# Patient Record
Sex: Female | Born: 1970 | Race: White | Hispanic: No | Marital: Married | Smoking: Former smoker
Health system: Southern US, Community
[De-identification: ages and names within clinical notes are randomized; demographics above are authoritative.]

## PROBLEM LIST (undated history)

## (undated) DIAGNOSIS — I10 Essential (primary) hypertension: Secondary | ICD-10-CM

## (undated) DIAGNOSIS — G43909 Migraine, unspecified, not intractable, without status migrainosus: Secondary | ICD-10-CM

## (undated) DIAGNOSIS — J302 Other seasonal allergic rhinitis: Secondary | ICD-10-CM

## (undated) DIAGNOSIS — J45909 Unspecified asthma, uncomplicated: Secondary | ICD-10-CM

## (undated) DIAGNOSIS — K589 Irritable bowel syndrome without diarrhea: Secondary | ICD-10-CM

## (undated) DIAGNOSIS — M1990 Unspecified osteoarthritis, unspecified site: Secondary | ICD-10-CM

## (undated) DIAGNOSIS — R112 Nausea with vomiting, unspecified: Secondary | ICD-10-CM

## (undated) DIAGNOSIS — R51 Headache: Secondary | ICD-10-CM

## (undated) DIAGNOSIS — F329 Major depressive disorder, single episode, unspecified: Secondary | ICD-10-CM

## (undated) DIAGNOSIS — E039 Hypothyroidism, unspecified: Secondary | ICD-10-CM

## (undated) DIAGNOSIS — L708 Other acne: Secondary | ICD-10-CM

## (undated) DIAGNOSIS — K1370 Unspecified lesions of oral mucosa: Secondary | ICD-10-CM

## (undated) DIAGNOSIS — K219 Gastro-esophageal reflux disease without esophagitis: Secondary | ICD-10-CM

## (undated) DIAGNOSIS — F419 Anxiety disorder, unspecified: Secondary | ICD-10-CM

## (undated) DIAGNOSIS — R768 Other specified abnormal immunological findings in serum: Secondary | ICD-10-CM

## (undated) DIAGNOSIS — F99 Mental disorder, not otherwise specified: Secondary | ICD-10-CM

## (undated) DIAGNOSIS — F319 Bipolar disorder, unspecified: Secondary | ICD-10-CM

## (undated) DIAGNOSIS — Z9889 Other specified postprocedural states: Secondary | ICD-10-CM

## (undated) DIAGNOSIS — R030 Elevated blood-pressure reading, without diagnosis of hypertension: Secondary | ICD-10-CM

## (undated) DIAGNOSIS — T7840XA Allergy, unspecified, initial encounter: Secondary | ICD-10-CM

## (undated) HISTORY — DX: Unspecified osteoarthritis, unspecified site: M19.90

## (undated) HISTORY — DX: Allergy, unspecified, initial encounter: T78.40XA

## (undated) HISTORY — DX: Unspecified asthma, uncomplicated: J45.909

## (undated) HISTORY — PX: WISDOM TOOTH EXTRACTION: SHX21

## (undated) HISTORY — PX: OTHER SURGICAL HISTORY: SHX169

## (undated) HISTORY — DX: Essential (primary) hypertension: I10

## (undated) HISTORY — PX: POLYPECTOMY: SHX149

## (undated) HISTORY — PX: COLONOSCOPY: SHX174

## (undated) HISTORY — PX: COLONOSCOPY W/ POLYPECTOMY: SHX1380

## (undated) HISTORY — PX: EYE SURGERY: SHX253

## (undated) HISTORY — DX: Migraine, unspecified, not intractable, without status migrainosus: G43.909

---
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
## 1994-04-25 HISTORY — PX: OTHER SURGICAL HISTORY: SHX169

## 2003-11-21 ENCOUNTER — Other Ambulatory Visit: Admission: RE | Admit: 2003-11-21 | Discharge: 2003-11-21 | Payer: Self-pay | Admitting: Obstetrics and Gynecology

## 2004-10-04 ENCOUNTER — Other Ambulatory Visit: Admission: RE | Admit: 2004-10-04 | Discharge: 2004-10-04 | Payer: Self-pay | Admitting: Obstetrics and Gynecology

## 2004-12-01 ENCOUNTER — Encounter: Admission: RE | Admit: 2004-12-01 | Discharge: 2004-12-01 | Payer: Self-pay | Admitting: Obstetrics and Gynecology

## 2005-01-31 ENCOUNTER — Ambulatory Visit: Payer: Self-pay | Admitting: Family Medicine

## 2005-09-27 ENCOUNTER — Ambulatory Visit: Payer: Self-pay | Admitting: Family Medicine

## 2005-10-06 ENCOUNTER — Ambulatory Visit: Payer: Self-pay | Admitting: Sports Medicine

## 2006-08-24 ENCOUNTER — Ambulatory Visit: Payer: Self-pay | Admitting: Internal Medicine

## 2006-09-08 DIAGNOSIS — F419 Anxiety disorder, unspecified: Secondary | ICD-10-CM | POA: Insufficient documentation

## 2006-09-08 DIAGNOSIS — L708 Other acne: Secondary | ICD-10-CM

## 2006-09-08 DIAGNOSIS — J45990 Exercise induced bronchospasm: Secondary | ICD-10-CM | POA: Insufficient documentation

## 2006-09-08 DIAGNOSIS — F329 Major depressive disorder, single episode, unspecified: Secondary | ICD-10-CM

## 2006-09-08 DIAGNOSIS — J302 Other seasonal allergic rhinitis: Secondary | ICD-10-CM | POA: Insufficient documentation

## 2006-09-08 DIAGNOSIS — F32A Depression, unspecified: Secondary | ICD-10-CM

## 2006-09-08 HISTORY — DX: Depression, unspecified: F32.A

## 2006-09-08 HISTORY — DX: Anxiety disorder, unspecified: F41.9

## 2006-09-08 HISTORY — DX: Other acne: L70.8

## 2006-09-13 ENCOUNTER — Encounter: Payer: Self-pay | Admitting: Internal Medicine

## 2006-09-13 ENCOUNTER — Ambulatory Visit: Payer: Self-pay | Admitting: Internal Medicine

## 2006-09-13 DIAGNOSIS — R030 Elevated blood-pressure reading, without diagnosis of hypertension: Secondary | ICD-10-CM

## 2006-09-13 HISTORY — DX: Elevated blood-pressure reading, without diagnosis of hypertension: R03.0

## 2006-09-13 LAB — CONVERTED CEMR LAB
ALT: 16 units/L (ref 0–40)
Alkaline Phosphatase: 46 units/L (ref 39–117)
Basophils Absolute: 0 10*3/uL (ref 0.0–0.1)
CO2: 29 meq/L (ref 19–32)
Calcium: 9.5 mg/dL (ref 8.4–10.5)
Chloride: 107 meq/L (ref 96–112)
GFR calc Af Amer: 122 mL/min
GFR calc non Af Amer: 101 mL/min
HCT: 37.1 % (ref 36.0–46.0)
HDL: 79.6 mg/dL (ref >39.0)
LDL Cholesterol: 108 mg/dL — ABNORMAL HIGH (ref 0–99)
MCHC: 34.5 g/dL (ref 30.0–36.0)
MCV: 89.7 fL (ref 78.0–100.0)
Neutro Abs: 3.6 10*3/uL (ref 1.4–7.7)
Sodium: 140 meq/L (ref 135–145)
Total Bilirubin: 1.1 mg/dL (ref 0.3–1.2)
Total Protein: 7.1 g/dL (ref 6.0–8.3)
Triglycerides: 62 mg/dL (ref 0–149)
VLDL: 12 mg/dL (ref 0–40)
WBC: 5.6 10*3/uL (ref 4.5–10.5)

## 2007-11-07 ENCOUNTER — Encounter (INDEPENDENT_AMBULATORY_CARE_PROVIDER_SITE_OTHER): Payer: Self-pay | Admitting: *Deleted

## 2007-11-07 ENCOUNTER — Ambulatory Visit: Payer: Self-pay | Admitting: Sports Medicine

## 2007-11-09 ENCOUNTER — Encounter (INDEPENDENT_AMBULATORY_CARE_PROVIDER_SITE_OTHER): Payer: Self-pay | Admitting: *Deleted

## 2009-01-13 ENCOUNTER — Encounter: Admission: RE | Admit: 2009-01-13 | Discharge: 2009-01-13 | Payer: Self-pay | Admitting: Obstetrics and Gynecology

## 2009-03-24 ENCOUNTER — Ambulatory Visit: Payer: Self-pay | Admitting: Internal Medicine

## 2009-03-24 DIAGNOSIS — K1370 Unspecified lesions of oral mucosa: Secondary | ICD-10-CM

## 2009-03-24 HISTORY — DX: Unspecified lesions of oral mucosa: K13.70

## 2009-06-24 ENCOUNTER — Encounter (INDEPENDENT_AMBULATORY_CARE_PROVIDER_SITE_OTHER): Payer: Self-pay | Admitting: *Deleted

## 2009-06-24 ENCOUNTER — Ambulatory Visit: Payer: Self-pay | Admitting: Sports Medicine

## 2010-04-25 HISTORY — PX: OTHER SURGICAL HISTORY: SHX169

## 2010-05-26 NOTE — Miscellaneous (Signed)
Summary: TDAP  Clinical Lists Changes  Orders: Added new Service order of Tdap => 72yrs IM (16109) - Signed Added new Service order of Admin 1st Vaccine (60454) - Signed Added new Service order of Admin 1st Vaccine [facility]) [phone]) - Signed Observations: Added new observation of TD BOOST VIS: 03/13/07 version given June 24, 2009. (06/24/2009 14:34) Added new observation of TD BOOSTERLO: [id number] (06/24/2009 14:34) Added new observation of TD BOOST EXP: 07/18/2011 (06/24/2009 14:34) Added new observation of TD BOOSTERBY: Glendal Cassaday CMA (06/24/2009 14:34) Added new observation of TD BOOSTERRT: IM (06/24/2009 14:34) Added new observation of TDBOOSTERDSE: 0.5 ml (06/24/2009 14:34) Added new observation of TD BOOSTERMF: Gl[employer] (06/24/2009 14:34) Added new observation of TD BOOST SIT: right deltoid (06/24/2009 14:34) Added new observation of TD BOOSTER: Tdap (06/24/2009 14:34)      Tetanus/Td Vaccine    Vaccine Type: Tdap    Site: right deltoid    Mfr: GlaxoSmithKline    Dose: 0.5 ml    Route: IM    Given by: Lillia Pauls CMA    Exp. Date: 07/18/2011    Lot #: [id number]    VIS given: 03/13/07 version given June 24, 2009.  Appended Document: TDAP     Allergies: No Known Drug Allergies   Complete Medication List: 1)  Doxycycline Hyclate 20 Mg Tabs (Doxycycline hyclate) .Marland Kitchen.. 1 tab by mouth daily 2)  Celexa 20 Mg Tabs (Citalopram hydrobromide) .Marland Kitchen.. 1 tab by mouth daily

## 2012-10-10 ENCOUNTER — Encounter (HOSPITAL_COMMUNITY): Payer: Self-pay | Admitting: Pharmacist

## 2012-10-16 ENCOUNTER — Other Ambulatory Visit: Payer: Self-pay | Admitting: Obstetrics and Gynecology

## 2012-10-16 NOTE — H&P (Signed)
42 y.o. yo complains of rectocele with splinting and close to introitus- had to push. Also menorrhagia- No IM. Using a lot pads. Lasting 10 days.Much longer periods and much heavier. Bleeding through tampons and pads. No IM. now bothering for one week. No issues with bladder. No leaking with cough or sneeze. No prolapse of ?cystocele. Felt like tampon. Pt's husband had vasectomy.  PMH: none. PSH: wrist, ankle, vein. Pgyn: no abnl paps.   PSH: sometime smoker  Meds: Effexor. clonazepam  No Known Allergies  @VITALS2 @  Lungs: clear to ascultation Cor:  RRR Abdomen:  soft, nontender, nondistended. Ex:  no cords, erythema Pelvic:  Female Genitalia: Vulva: no masses, atrophy, or lesions. Vagina: no tenderness, erythema, cystocele, abnormal vaginal discharge, or vesicle(s) or ulcers and rectocele; to 0; cystocele -2; no real movement of urethra; some decensus but not a lot.. Cervix: no discharge or cervical motion tenderness and grossly normal. Uterus: midline, mobile, non-tender, normal shape, no uterine prolapse, and enlarged; 9-10 weeks size. Bladder/Urethra: no urethral discharge or mass and normal meatus, bladder non distended, and Urethra well supported. Adnexa/Parametria: no parametrial tenderness or mass and no adnexal tenderness or ovarian mass.\Rectal Exam: Rectum: normal perianal skin and sphincter tone and no hemorrhoids or masses.  [place] 8x4.5x4, em 9 mm; LO 3 Lcyst 2.2; RO 2.1. Simple cyst, no increase in BF. No ff.   A:  Menorrhagia and symptomatic rectocele.  No SUI and no significant cystocele.  Husband had vasectomy.   P:  For d&c, hysterscopy, Novasure, posterior repair.   All risks, benefits and alternatives d/w patient and she desires to proceed.  Patient will receive preop antibiotics and SCDs during the operation.     Maybelle Depaoli A

## 2012-10-17 ENCOUNTER — Encounter (HOSPITAL_COMMUNITY): Payer: Self-pay | Admitting: Anesthesiology

## 2012-10-17 ENCOUNTER — Ambulatory Visit (HOSPITAL_COMMUNITY)
Admission: RE | Admit: 2012-10-17 | Discharge: 2012-10-17 | Disposition: A | Discharge status: Home / Self Care | Payer: BC Managed Care – PPO | Source: Ambulatory Visit | Attending: Obstetrics and Gynecology | Admitting: Obstetrics and Gynecology

## 2012-10-17 ENCOUNTER — Encounter (HOSPITAL_COMMUNITY): Payer: Self-pay | Admitting: *Deleted

## 2012-10-17 ENCOUNTER — Ambulatory Visit (HOSPITAL_COMMUNITY): Payer: BC Managed Care – PPO | Admitting: Anesthesiology

## 2012-10-17 ENCOUNTER — Encounter (HOSPITAL_COMMUNITY)
Admission: RE | Disposition: A | Discharge status: Home / Self Care | Payer: Self-pay | Source: Ambulatory Visit | Attending: Obstetrics and Gynecology

## 2012-10-17 DIAGNOSIS — N920 Excessive and frequent menstruation with regular cycle: Secondary | ICD-10-CM | POA: Insufficient documentation

## 2012-10-17 DIAGNOSIS — N816 Rectocele: Secondary | ICD-10-CM | POA: Insufficient documentation

## 2012-10-17 HISTORY — PX: RECTOCELE REPAIR: SHX761

## 2012-10-17 HISTORY — DX: Headache: R51

## 2012-10-17 HISTORY — DX: Nausea with vomiting, unspecified: R11.2

## 2012-10-17 HISTORY — DX: Mental disorder, not otherwise specified: F99

## 2012-10-17 HISTORY — DX: Gastro-esophageal reflux disease without esophagitis: K21.9

## 2012-10-17 HISTORY — DX: Other specified postprocedural states: Z98.890

## 2012-10-17 HISTORY — PX: DILITATION & CURRETTAGE/HYSTROSCOPY WITH NOVASURE ABLATION: SHX5568

## 2012-10-17 HISTORY — DX: Other seasonal allergic rhinitis: J30.2

## 2012-10-17 HISTORY — DX: Anxiety disorder, unspecified: F41.9

## 2012-10-17 LAB — CBC
HCT: 38.4 % (ref 36.0–46.0)
MCHC: 34.6 g/dL (ref 30.0–36.0)
MCV: 89.5 fL (ref 78.0–100.0)
RBC: 4.29 MIL/uL (ref 3.87–5.11)
RDW: 13.6 % (ref 11.5–15.5)

## 2012-10-17 SURGERY — DILATATION & CURETTAGE/HYSTEROSCOPY WITH NOVASURE ABLATION
Anesthesia: General | Site: Vagina | Wound class: Clean Contaminated

## 2012-10-17 MED ORDER — ESTRADIOL 0.1 MG/GM VA CREA
TOPICAL_CREAM | VAGINAL | Status: AC
  Filled 2012-10-17: qty 42.5

## 2012-10-17 MED ORDER — LIDOCAINE-EPINEPHRINE 0.5 %-1:200000 IJ SOLN
INTRAMUSCULAR | Status: DC | PRN
  Administered 2012-10-17: 9 mL

## 2012-10-17 MED ORDER — PROMETHAZINE HCL 12.5 MG PO TABS: 12.5000 mg | ORAL_TABLET | Freq: Four times a day (QID) | ORAL | Status: DC | PRN

## 2012-10-17 MED ORDER — PROMETHAZINE HCL 25 MG/ML IJ SOLN: 6.2500 mg | INTRAMUSCULAR | Status: DC | PRN

## 2012-10-17 MED ORDER — MIDAZOLAM HCL 2 MG/2ML IJ SOLN
INTRAMUSCULAR | Status: AC
  Filled 2012-10-17: qty 2

## 2012-10-17 MED ORDER — CEFAZOLIN SODIUM-DEXTROSE 2-3 GM-% IV SOLR
INTRAVENOUS | Status: AC
  Filled 2012-10-17: qty 50

## 2012-10-17 MED ORDER — OXYCODONE-ACETAMINOPHEN 5-325 MG PO TABS: 1.0000 | ORAL_TABLET | ORAL | Status: DC | PRN

## 2012-10-17 MED ORDER — KETOROLAC TROMETHAMINE 30 MG/ML IJ SOLN: 15.0000 mg | Freq: Once | INTRAMUSCULAR | Status: DC | PRN

## 2012-10-17 MED ORDER — DEXAMETHASONE SODIUM PHOSPHATE 10 MG/ML IJ SOLN
INTRAMUSCULAR | Status: DC | PRN
  Administered 2012-10-17: 10 mg via INTRAVENOUS

## 2012-10-17 MED ORDER — MEPERIDINE HCL 25 MG/ML IJ SOLN
INTRAMUSCULAR | Status: AC
  Administered 2012-10-17: 12.5 mg via INTRAVENOUS
  Filled 2012-10-17: qty 1

## 2012-10-17 MED ORDER — LACTATED RINGERS IV SOLN
INTRAVENOUS | Status: DC
  Administered 2012-10-17 (×3): via INTRAVENOUS

## 2012-10-17 MED ORDER — KETOROLAC TROMETHAMINE 30 MG/ML IJ SOLN
INTRAMUSCULAR | Status: AC
  Filled 2012-10-17: qty 1

## 2012-10-17 MED ORDER — MIDAZOLAM HCL 2 MG/2ML IJ SOLN: 0.5000 mg | Freq: Once | INTRAMUSCULAR | Status: DC | PRN

## 2012-10-17 MED ORDER — FENTANYL CITRATE 0.05 MG/ML IJ SOLN: 25.0000 ug | INTRAMUSCULAR | Status: DC | PRN

## 2012-10-17 MED ORDER — OXYCODONE-ACETAMINOPHEN 5-325 MG PO TABS
ORAL_TABLET | ORAL | Status: AC
  Administered 2012-10-17: 1 via ORAL
  Filled 2012-10-17: qty 1

## 2012-10-17 MED ORDER — LIDOCAINE-EPINEPHRINE 0.5 %-1:200000 IJ SOLN
INTRAMUSCULAR | Status: AC
  Filled 2012-10-17: qty 1

## 2012-10-17 MED ORDER — FENTANYL CITRATE 0.05 MG/ML IJ SOLN
INTRAMUSCULAR | Status: AC
  Filled 2012-10-17: qty 5

## 2012-10-17 MED ORDER — CEFAZOLIN SODIUM-DEXTROSE 2-3 GM-% IV SOLR
2.0000 g | INTRAVENOUS | Status: AC
  Administered 2012-10-17: 2 g via INTRAVENOUS

## 2012-10-17 MED ORDER — ESTRADIOL 0.1 MG/GM VA CREA
TOPICAL_CREAM | VAGINAL | Status: DC | PRN
  Administered 2012-10-17: 1 via VAGINAL

## 2012-10-17 MED ORDER — MEPERIDINE HCL 25 MG/ML IJ SOLN
6.2500 mg | INTRAMUSCULAR | Status: DC | PRN
  Administered 2012-10-17: 12.5 mg via INTRAVENOUS

## 2012-10-17 MED ORDER — SCOPOLAMINE 1 MG/3DAYS TD PT72: 1.0000 | MEDICATED_PATCH | TRANSDERMAL | Status: DC

## 2012-10-17 MED ORDER — SCOPOLAMINE 1 MG/3DAYS TD PT72
MEDICATED_PATCH | TRANSDERMAL | Status: AC
  Administered 2012-10-17: 1.5 mg via TRANSDERMAL
  Filled 2012-10-17: qty 1

## 2012-10-17 MED ORDER — LIDOCAINE HCL (CARDIAC) 20 MG/ML IV SOLN
INTRAVENOUS | Status: AC
  Filled 2012-10-17: qty 5

## 2012-10-17 MED ORDER — KETOROLAC TROMETHAMINE 30 MG/ML IJ SOLN
INTRAMUSCULAR | Status: DC | PRN
  Administered 2012-10-17: 30 mg via INTRAVENOUS

## 2012-10-17 MED ORDER — PROPOFOL 10 MG/ML IV BOLUS
INTRAVENOUS | Status: DC | PRN
  Administered 2012-10-17: 200 mg via INTRAVENOUS

## 2012-10-17 MED ORDER — MIDAZOLAM HCL 5 MG/5ML IJ SOLN
INTRAMUSCULAR | Status: DC | PRN
  Administered 2012-10-17: 2 mg via INTRAVENOUS

## 2012-10-17 MED ORDER — DEXAMETHASONE SODIUM PHOSPHATE 10 MG/ML IJ SOLN
INTRAMUSCULAR | Status: AC
  Filled 2012-10-17: qty 1

## 2012-10-17 MED ORDER — FENTANYL CITRATE 0.05 MG/ML IJ SOLN
INTRAMUSCULAR | Status: DC | PRN
  Administered 2012-10-17 (×4): 50 ug via INTRAVENOUS

## 2012-10-17 MED ORDER — LACTATED RINGERS IR SOLN
Status: DC | PRN
  Administered 2012-10-17: 3000 mL

## 2012-10-17 MED ORDER — LIDOCAINE HCL (CARDIAC) 20 MG/ML IV SOLN
INTRAVENOUS | Status: DC | PRN
  Administered 2012-10-17: 50 mg via INTRAVENOUS

## 2012-10-17 MED ORDER — ONDANSETRON HCL 4 MG/2ML IJ SOLN
INTRAMUSCULAR | Status: AC
  Filled 2012-10-17: qty 2

## 2012-10-17 MED ORDER — PROPOFOL 10 MG/ML IV EMUL
INTRAVENOUS | Status: AC
  Filled 2012-10-17: qty 20

## 2012-10-17 SURGICAL SUPPLY — 28 items
ABLATOR ENDOMETRIAL BIPOLAR (ABLATOR) ×2 IMPLANT
CANISTER SUCTION 2500CC (MISCELLANEOUS) ×2 IMPLANT
CATH ROBINSON RED A/P 16FR (CATHETERS) ×2 IMPLANT
CLOTH BEACON ORANGE TIMEOUT ST (SAFETY) ×2 IMPLANT
CONT PATH 16OZ SNAP LID 3702 (MISCELLANEOUS) IMPLANT
CONTAINER PREFILL 10% NBF 60ML (FORM) ×4 IMPLANT
DECANTER SPIKE VIAL GLASS SM (MISCELLANEOUS) IMPLANT
DRESSING TELFA 8X3 (GAUZE/BANDAGES/DRESSINGS) ×2 IMPLANT
ELECT REM PT RETURN 9FT ADLT (ELECTROSURGICAL)
ELECTRODE REM PT RTRN 9FT ADLT (ELECTROSURGICAL) IMPLANT
GAUZE PACKING 2X5 YD STERILE (GAUZE/BANDAGES/DRESSINGS) ×2 IMPLANT
GLOVE BIO SURGEON STRL SZ7 (GLOVE) ×2 IMPLANT
GOWN STRL REIN XL XLG (GOWN DISPOSABLE) ×8 IMPLANT
LOOP ANGLED CUTTING 22FR (CUTTING LOOP) IMPLANT
NEEDLE HYPO 22GX1.5 SAFETY (NEEDLE) ×2 IMPLANT
NEEDLE SPNL 22GX3.5 QUINCKE BK (NEEDLE) IMPLANT
NS IRRIG 1000ML POUR BTL (IV SOLUTION) ×2 IMPLANT
PACK HYSTEROSCOPY LF (CUSTOM PROCEDURE TRAY) ×2 IMPLANT
PACK VAGINAL WOMENS (CUSTOM PROCEDURE TRAY) ×2 IMPLANT
PAD OB MATERNITY 4.3X12.25 (PERSONAL CARE ITEMS) ×2 IMPLANT
PENCIL BUTTON HOLSTER BLD 10FT (ELECTRODE) ×2 IMPLANT
SUT VIC AB 0 CT1 18XCR BRD8 (SUTURE) ×1 IMPLANT
SUT VIC AB 0 CT1 8-18 (SUTURE) ×1
SUT VIC AB 2-0 CT1 27 (SUTURE) ×4
SUT VIC AB 2-0 CT1 TAPERPNT 27 (SUTURE) ×4 IMPLANT
TOWEL OR 17X24 6PK STRL BLUE (TOWEL DISPOSABLE) ×4 IMPLANT
TRAY FOLEY CATH 14FR (SET/KITS/TRAYS/PACK) ×2 IMPLANT
WATER STERILE IRR 1000ML POUR (IV SOLUTION) ×2 IMPLANT

## 2012-10-17 NOTE — Anesthesia Postprocedure Evaluation (Signed)
  Anesthesia Post-op Note  Patient: Whitney King  Procedure(s) Performed: Procedure(s): DILATATION & CURETTAGE/HYSTEROSCOPY WITH NOVASURE ABLATION (N/A) POSTERIOR REPAIR (RECTOCELE) (N/A) Patient is awake and responsive. Pain and nausea are reasonably well controlled. Vital signs are stable and clinically acceptable. Oxygen saturation is clinically acceptable. There are no apparent anesthetic complications at this time. Patient is ready for discharge.

## 2012-10-17 NOTE — Op Note (Signed)
10/17/2012  8:16 AM  PATIENT:  Whitney King  42 y.o. female  PRE-OPERATIVE DIAGNOSIS:  MENORRHAGIA / RECTOCELE [phone] / [phone]  POST-OPERATIVE DIAGNOSIS:  MENORRHAGIA / RECTOCELE  PROCEDURE:  Procedure(s): DILATATION & CURETTAGE/HYSTEROSCOPY WITH NOVASURE ABLATION (N/A) POSTERIOR REPAIR (RECTOCELE) (N/A)  SURGEON:  Surgeon(s) and Role:    * Loney Laurence, MD - Primary    * W Lodema Hong, MD - Assisting   ANESTHESIA:   general  EBL:  Total I/O In: 2000 [I.V.:2000] Out: 120 [Urine:70; Blood:50]   LOCAL MEDICATIONS USED:  MARCAINE     SPECIMEN:  Source of Specimen:  uterine currettings  DISPOSITION OF SPECIMEN:  PATHOLOGY  COUNTS:  YES  TOURNIQUET:  * No tourniquets in log *  DICTATION: .Note written in EPIC  PLAN OF CARE: Discharge to home after PACU  PATIENT DISPOSITION:  PACU - hemodynamically stable.   Delay start of Pharmacological VTE agent (>24hrs) due to surgical blood loss or risk of bleeding: not applicable  Findings:  Shaggy endometrium.  Findings:  Normal cavity of uterus; cavity length 6, width 3.2, power 114, time 60 sec.  Minimal hysteroscopy deficit.  Rectocele to 0.    Complications: none.  After adequate anesthesia was achieved, the patient was prepped and draped in the usual sterile fashion.  The bladder was emptied with a red rubber and the speculum was placed in the vagina and the cervix stabilized with a single-tooth tenaculum.  The cervix was dilated with pratt dilators and the hysteroscope passed inside the endometrial cavity.  The above findings were noted and sharp curettage was then performed and uterine curettings sent to path.  The cavity length was 6 cm and the Novasure instrument successfully seated.  The width was 3.2 cm and the CO2 test passed.  The ablation went for 60 sec at at power of 114 watts.  Once the ablation was completed, the Novasure instrument was removed and the hysteroscope passed into the cavity again.  Good  contact was seen in all areas.    The speculum was removed and an inverted triangle was incised on the perineum.  The vaginal mucosa was then tented up in the midline with Allis's and then injected with 0.25% marcaine with epi.  The mucosa was incised in the midline with the mets and then removed from the underlying rectovaginal fascia laterally.  The rectovaginal mucosa was then closed with five 0 vicryl mattress sutures.  The vaginal mucosa was then trimmed and closed with a running lock stitch of 2-0 vicryl in the fashion of an episiotomy.  The vagina was packed with estrace laden 2 inch Nugauze.  The bladder was emptied again.    All instruments were then removed from the uterus and vagina.  The patient tolerated the procedure well.    Bransyn Adami A

## 2012-10-17 NOTE — Progress Notes (Signed)
Dr Henderson Cloud called regarding peripad more than half bloody. "Change the ice peripad to plain peripad, call back in an hour. Leave the vaginal packing in

## 2012-10-17 NOTE — Anesthesia Preprocedure Evaluation (Addendum)
Anesthesia Evaluation  Patient identified by MRN, date of birth, ID band Patient awake    Reviewed: Allergy & Precautions, H&P , Patient's Chart, lab work & pertinent test results, reviewed documented beta blocker date and time   History of Anesthesia Complications Negative for: history of anesthetic complications  Airway Mallampati: II TM Distance: >3 FB Neck ROM: full    Dental no notable dental hx.    Pulmonary neg pulmonary ROS, asthma ,  breath sounds clear to auscultation  Pulmonary exam normal       Cardiovascular Exercise Tolerance: Good negative cardio ROS  Rhythm:regular Rate:Normal     Neuro/Psych negative neurological ROS  negative psych ROS   GI/Hepatic negative GI ROS, Neg liver ROS,   Endo/Other  negative endocrine ROS  Renal/GU negative Renal ROS     Musculoskeletal   Abdominal   Peds  Hematology negative hematology ROS (+)   Anesthesia Other Findings   Reproductive/Obstetrics negative OB ROS                           Anesthesia Physical Anesthesia Plan  ASA: II  Anesthesia Plan: General LMA   Post-op Pain Management:    Induction:   Airway Management Planned:   Additional Equipment:   Intra-op Plan:   Post-operative Plan:   Informed Consent: I have reviewed the patients History and Physical, chart, labs and discussed the procedure including the risks, benefits and alternatives for the proposed anesthesia with the patient or authorized representative who has indicated his/her understanding and acceptance.   Dental Advisory Given  Plan Discussed with: CRNA and Surgeon  Anesthesia Plan Comments:        Anesthesia Quick Evaluation Triple antiemetic therapy

## 2012-10-17 NOTE — OR Nursing (Signed)
Take vaginal packing out. Put a clean peripad on and call back in 30 mins. Pt back to bed

## 2012-10-17 NOTE — Transfer of Care (Signed)
Immediate Anesthesia Transfer of Care Note  Patient: Whitney King  Procedure(s) Performed: Procedure(s): DILATATION & CURETTAGE/HYSTEROSCOPY WITH NOVASURE ABLATION (N/A) POSTERIOR REPAIR (RECTOCELE) (N/A)  Patient Location: PACU  Anesthesia Type:General  Level of Consciousness: awake, alert  and oriented  Airway & Oxygen Therapy: Patient Spontanous Breathing and Patient connected to nasal cannula oxygen  Post-op Assessment: Report given to PACU RN  Post vital signs: Reviewed  Complications: No apparent anesthesia complications

## 2012-10-17 NOTE — Preoperative (Signed)
Beta Blockers   Reason not to administer Beta Blockers:Not Applicable 

## 2012-10-17 NOTE — Brief Op Note (Signed)
10/17/2012  8:16 AM  PATIENT:  Whitney King  41 y.o. female  PRE-OPERATIVE DIAGNOSIS:  MENORRHAGIA / RECTOCELE [id number] / [id number]  POST-OPERATIVE DIAGNOSIS:  MENORRHAGIA / RECTOCELE  PROCEDURE:  Procedure(s): DILATATION & CURETTAGE/HYSTEROSCOPY WITH NOVASURE ABLATION (N/A) POSTERIOR REPAIR (RECTOCELE) (N/A)  SURGEON:  Surgeon(s) and Role:    * Silvanna Ohmer A Tylee Newby, MD - Primary    * W Scott Bowie, MD - Assisting   ANESTHESIA:   general  EBL:  Total I/O In: 2000 [I.V.:2000] Out: 120 [Urine:70; Blood:50]   LOCAL MEDICATIONS USED:  MARCAINE     SPECIMEN:  Source of Specimen:  uterine currettings  DISPOSITION OF SPECIMEN:  PATHOLOGY  COUNTS:  YES  TOURNIQUET:  * No tourniquets in log *  DICTATION: .Note written in EPIC  PLAN OF CARE: Discharge to home after PACU  PATIENT DISPOSITION:  PACU - hemodynamically stable.   Delay start of Pharmacological VTE agent (>24hrs) due to surgical blood loss or risk of bleeding: not applicable  Findings:  Shaggy endometrium.  Findings:  Normal cavity of uterus; cavity length 6, width 3.2, power 114, time 60 sec.  Minimal hysteroscopy deficit.  Rectocele to 0.    Complications: none.  After adequate anesthesia was achieved, the patient was prepped and draped in the usual sterile fashion.  The bladder was emptied with a red rubber and the speculum was placed in the vagina and the cervix stabilized with a single-tooth tenaculum.  The cervix was dilated with pratt dilators and the hysteroscope passed inside the endometrial cavity.  The above findings were noted and sharp curettage was then performed and uterine curettings sent to path.  The cavity length was 6 cm and the Novasure instrument successfully seated.  The width was 3.2 cm and the CO2 test passed.  The ablation went for 60 sec at at power of 114 watts.  Once the ablation was completed, the Novasure instrument was removed and the hysteroscope passed into the cavity again.  Good  contact was seen in all areas.    The speculum was removed and an inverted triangle was incised on the perineum.  The vaginal mucosa was then tented up in the midline with Allis's and then injected with 0.25% marcaine with epi.  The mucosa was incised in the midline with the mets and then removed from the underlying rectovaginal fascia laterally.  The rectovaginal mucosa was then closed with five 0 vicryl mattress sutures.  The vaginal mucosa was then trimmed and closed with a running lock stitch of 2-0 vicryl in the fashion of an episiotomy.  The vagina was packed with estrace laden 2 inch Nugauze.  The bladder was emptied again.    All instruments were then removed from the uterus and vagina.  The patient tolerated the procedure well.    [name]   

## 2012-10-17 NOTE — Progress Notes (Signed)
There has been no change in the patients history, status or exam since the history and physical.  Filed Vitals:   10/15/12 1633 10/17/12 0624  BP:  145/87  Pulse:  74  Temp:  98.1 F (36.7 C)  TempSrc:  Oral  Resp:  20  Height: 5\' 6"  (1.676 m) 5\' 6"  (1.676 m)  Weight: 68.04 kg (150 lb) 68.947 kg (152 lb)  SpO2:  100%    Lab Results  Component Value Date   WBC 6.0 10/07/2012   HGB 13.3 10/07/2012   HCT 38.4 10/07/2012   MCV 89.5 10/07/2012   PLT 206 10/07/2012    Gaelyn Tukes A

## 2012-10-17 NOTE — OR Nursing (Signed)
Dr Henderson Cloud is pleased with patient Whitney King. Pt can go home and instructed to call her if her vaginal flow is heavier than a menstrual cycle.

## 2012-10-18 ENCOUNTER — Encounter (HOSPITAL_COMMUNITY): Payer: Self-pay | Admitting: Obstetrics and Gynecology

## 2013-03-29 ENCOUNTER — Ambulatory Visit (INDEPENDENT_AMBULATORY_CARE_PROVIDER_SITE_OTHER): Payer: BC Managed Care – PPO | Admitting: Psychology

## 2013-03-29 DIAGNOSIS — F4323 Adjustment disorder with mixed anxiety and depressed mood: Secondary | ICD-10-CM

## 2013-04-05 ENCOUNTER — Ambulatory Visit (INDEPENDENT_AMBULATORY_CARE_PROVIDER_SITE_OTHER): Payer: BC Managed Care – PPO | Admitting: Psychology

## 2013-04-05 DIAGNOSIS — F4323 Adjustment disorder with mixed anxiety and depressed mood: Secondary | ICD-10-CM

## 2013-04-12 ENCOUNTER — Ambulatory Visit (INDEPENDENT_AMBULATORY_CARE_PROVIDER_SITE_OTHER): Payer: BC Managed Care – PPO | Admitting: Psychology

## 2013-04-12 DIAGNOSIS — F4323 Adjustment disorder with mixed anxiety and depressed mood: Secondary | ICD-10-CM

## 2013-04-24 ENCOUNTER — Ambulatory Visit (INDEPENDENT_AMBULATORY_CARE_PROVIDER_SITE_OTHER): Payer: BC Managed Care – PPO | Admitting: Psychology

## 2013-04-24 DIAGNOSIS — F4323 Adjustment disorder with mixed anxiety and depressed mood: Secondary | ICD-10-CM

## 2013-05-17 ENCOUNTER — Ambulatory Visit (INDEPENDENT_AMBULATORY_CARE_PROVIDER_SITE_OTHER): Payer: 59 | Admitting: Psychology

## 2013-05-17 DIAGNOSIS — F4323 Adjustment disorder with mixed anxiety and depressed mood: Secondary | ICD-10-CM

## 2013-06-14 ENCOUNTER — Ambulatory Visit (INDEPENDENT_AMBULATORY_CARE_PROVIDER_SITE_OTHER): Payer: 59 | Admitting: Psychology

## 2013-06-14 DIAGNOSIS — F4323 Adjustment disorder with mixed anxiety and depressed mood: Secondary | ICD-10-CM | POA: Diagnosis not present

## 2013-06-28 ENCOUNTER — Ambulatory Visit (INDEPENDENT_AMBULATORY_CARE_PROVIDER_SITE_OTHER): Payer: 59 | Admitting: Psychology

## 2013-06-28 DIAGNOSIS — F4323 Adjustment disorder with mixed anxiety and depressed mood: Secondary | ICD-10-CM

## 2013-08-09 ENCOUNTER — Ambulatory Visit (INDEPENDENT_AMBULATORY_CARE_PROVIDER_SITE_OTHER): Payer: 59 | Admitting: Psychology

## 2013-08-09 DIAGNOSIS — F4323 Adjustment disorder with mixed anxiety and depressed mood: Secondary | ICD-10-CM | POA: Diagnosis not present

## 2013-08-16 ENCOUNTER — Other Ambulatory Visit: Payer: Self-pay | Admitting: Obstetrics and Gynecology

## 2013-09-13 ENCOUNTER — Ambulatory Visit (INDEPENDENT_AMBULATORY_CARE_PROVIDER_SITE_OTHER): Payer: 59 | Admitting: Psychology

## 2013-09-13 DIAGNOSIS — F4323 Adjustment disorder with mixed anxiety and depressed mood: Secondary | ICD-10-CM

## 2013-09-23 ENCOUNTER — Emergency Department (HOSPITAL_BASED_OUTPATIENT_CLINIC_OR_DEPARTMENT_OTHER)
Admission: EM | Admit: 2013-09-23 | Discharge: 2013-09-23 | Disposition: A | Discharge status: Home / Self Care | Payer: 59 | Attending: Emergency Medicine | Admitting: Emergency Medicine

## 2013-09-23 ENCOUNTER — Encounter (HOSPITAL_BASED_OUTPATIENT_CLINIC_OR_DEPARTMENT_OTHER): Payer: Self-pay | Admitting: Emergency Medicine

## 2013-09-23 DIAGNOSIS — G43909 Migraine, unspecified, not intractable, without status migrainosus: Secondary | ICD-10-CM

## 2013-09-23 DIAGNOSIS — Z791 Long term (current) use of non-steroidal anti-inflammatories (NSAID): Secondary | ICD-10-CM | POA: Insufficient documentation

## 2013-09-23 DIAGNOSIS — Z8719 Personal history of other diseases of the digestive system: Secondary | ICD-10-CM | POA: Insufficient documentation

## 2013-09-23 DIAGNOSIS — F411 Generalized anxiety disorder: Secondary | ICD-10-CM | POA: Insufficient documentation

## 2013-09-23 DIAGNOSIS — Z79899 Other long term (current) drug therapy: Secondary | ICD-10-CM | POA: Insufficient documentation

## 2013-09-23 DIAGNOSIS — F17200 Nicotine dependence, unspecified, uncomplicated: Secondary | ICD-10-CM | POA: Insufficient documentation

## 2013-09-23 MED ORDER — DIPHENHYDRAMINE HCL 50 MG/ML IJ SOLN
25.0000 mg | Freq: Once | INTRAMUSCULAR | Status: AC
  Administered 2013-09-23: 25 mg via INTRAVENOUS
  Filled 2013-09-23: qty 1

## 2013-09-23 MED ORDER — SODIUM CHLORIDE 0.9 % IV BOLUS (SEPSIS)
1000.0000 mL | Freq: Once | INTRAVENOUS | Status: AC
  Administered 2013-09-23: 1000 mL via INTRAVENOUS

## 2013-09-23 MED ORDER — KETOROLAC TROMETHAMINE 30 MG/ML IJ SOLN
30.0000 mg | Freq: Once | INTRAMUSCULAR | Status: AC
  Administered 2013-09-23: 30 mg via INTRAVENOUS
  Filled 2013-09-23: qty 1

## 2013-09-23 MED ORDER — METOCLOPRAMIDE HCL 5 MG/ML IJ SOLN
10.0000 mg | Freq: Once | INTRAMUSCULAR | Status: AC
  Administered 2013-09-23: 10 mg via INTRAVENOUS
  Filled 2013-09-23: qty 2

## 2013-09-23 NOTE — Discharge Instructions (Signed)
Migraine Headache A migraine headache is an intense, throbbing pain on one or both sides of your head. A migraine can last for 30 minutes to several hours. CAUSES  The exact cause of a migraine headache is not always known. However, a migraine may be caused when nerves in the brain become irritated and release chemicals that cause inflammation. This causes pain. Certain things may also trigger migraines, such as:  Alcohol.  Smoking.  Stress.  Menstruation.  Aged cheeses.  Foods or drinks that contain nitrates, glutamate, aspartame, or tyramine.  Lack of sleep.  Chocolate.  Caffeine.  Hunger.  Physical exertion.  Fatigue.  Medicines used to treat chest pain (nitroglycerine), birth control pills, estrogen, and some blood pressure medicines. SIGNS AND SYMPTOMS  Pain on one or both sides of your head.  Pulsating or throbbing pain.  Severe pain that prevents daily activities.  Pain that is aggravated by any physical activity.  Nausea, vomiting, or both.  Dizziness.  Pain with exposure to bright lights, loud noises, or activity.  General sensitivity to bright lights, loud noises, or smells. Before you get a migraine, you may get warning signs that a migraine is coming (aura). An aura may include:  Seeing flashing lights.  Seeing bright spots, halos, or zig-zag lines.  Having tunnel vision or blurred vision.  Having feelings of numbness or tingling.  Having trouble talking.  Having muscle weakness. DIAGNOSIS  A migraine headache is often diagnosed based on:  Symptoms.  Physical exam.  A CT scan or MRI of your head. These imaging tests cannot diagnose migraines, but they can help rule out other causes of headaches. TREATMENT Medicines may be given for pain and nausea. Medicines can also be given to help prevent recurrent migraines.  HOME CARE INSTRUCTIONS  Only take over-the-counter or prescription medicines for pain or discomfort as directed by your  health care provider. The use of long-term narcotics is not recommended.  Lie down in a dark, quiet room when you have a migraine.  Keep a journal to find out what may trigger your migraine headaches. For example, write down:  What you eat and drink.  How much sleep you get.  Any change to your diet or medicines.  Limit alcohol consumption.  Quit smoking if you smoke.  Get 7 9 hours of sleep, or as recommended by your health care provider.  Limit stress.  Keep lights dim if bright lights bother you and make your migraines worse. SEEK IMMEDIATE MEDICAL CARE IF:   Your migraine becomes severe.  You have a fever.  You have a stiff neck.  You have vision loss.  You have muscular weakness or loss of muscle control.  You start losing your balance or have trouble walking.  You feel faint or pass out.  You have severe symptoms that are different from your first symptoms. MAKE SURE YOU:   Understand these instructions.  Will watch your condition.  Will get help right away if you are not doing well or get worse. Document Released: 04/11/2005 Document Revised: 01/30/2013 Document Reviewed: 12/17/2012 [employer] Patient Information 2014 [employer].  

## 2013-09-23 NOTE — ED Provider Notes (Signed)
CSN: [id number]     Arrival date & time 09/23/13  0145 History   First MD Initiated Contact with Patient 09/23/13 0159     Chief Complaint  Patient presents with  . Migraine     (Consider location/radiation/quality/duration/timing/severity/associated sxs/prior Treatment) HPI This is a 43 year old female with a history of migraine headaches. She is here with a headache that began yesterday evening about 9 PM. The headache is located in her forehead bilaterally. Described as throbbing. She has taken Celebrex, Percocet and a triptan tablet without relief. Her headache is not exacerbated by light. She has had nausea and vomiting with it. She denies blurred vision or focal neurologic deficit. She describes the pain is moderate to severe.  Past Medical History  Diagnosis Date  . PONV (postoperative nausea and vomiting)   . SVD (spontaneous vaginal delivery)     x 1  . Anxiety   . Mental disorder   . Seasonal allergies   . GERD (gastroesophageal reflux disease)     diet controlled only  . Headache(784.0)     otc meds prn   Past Surgical History  Procedure Laterality Date  . Right ankle surgery      RECONSTRUCTION  . Left wrist surgery      fracture repair  . Wisdom tooth extraction    . Eye surgery      [place] - bilateral eyes  . Left leg vein surgery    . Dilitation & currettage/hystroscopy with novasure ablation N/A 10/17/2012    Procedure: DILATATION & CURETTAGE/HYSTEROSCOPY WITH NOVASURE ABLATION;  Surgeon: Daria Pastures, MD;  Location: [facility];  Service: Gynecology;  Laterality: N/A;  . Rectocele repair N/A 10/17/2012    Procedure: POSTERIOR REPAIR (RECTOCELE);  Surgeon: Daria Pastures, MD;  Location: [facility];  Service: Gynecology;  Laterality: N/A;   No family history on file. History  Substance Use Topics  . Smoking status: Current Some Day Smoker -- 0.05 packs/day for 20 years    Types: Cigarettes  . Smokeless tobacco: Former Systems developer  . Alcohol Use: 2.4 oz/week    4  Glasses of wine per week     Comment: socially   OB History   Grav Para Term Preterm Abortions TAB SAB Ect Mult Living                 Review of Systems  All other systems reviewed and are negative.   Allergies  Review of patient's allergies indicates no known allergies.  Home Medications   Prior to Admission medications   Medication Sig Start Date End Date Taking? Authorizing Provider  celecoxib (CELEBREX) 200 MG capsule Take 200 mg by mouth 2 (two) times daily.   Yes Historical Provider, MD  cetirizine (ZYRTEC) 10 MG tablet Take 10 mg by mouth daily as needed for allergies.   Yes Historical Provider, MD  clonazePAM (KLONOPIN) 0.5 MG tablet Take 0.5 mg by mouth as needed for anxiety.   Yes Historical Provider, MD  diphenhydrAMINE (BENADRYL) 50 MG tablet Take 50 mg by mouth at bedtime as needed for itching or sleep.   Yes Historical Provider, MD  venlafaxine XR (EFFEXOR-XR) 75 MG 24 hr capsule Take 75 mg by mouth daily.   Yes Historical Provider, MD  oxyCODONE-acetaminophen (ROXICET) 5-325 MG per tablet Take 1 tablet by mouth every 4 (four) hours as needed for pain. 10/17/12   Daria Pastures, MD  promethazine (PHENERGAN) 12.5 MG tablet Take 1 tablet (12.5 mg total) by mouth every 6 (six)  hours as needed for nausea. 10/17/12   Daria Pastures, MD   BP 177/109  Pulse 75  Temp(Src) 97.9 F (36.6 C) (Oral)  Resp 20  Ht 5\' 6"  (1.676 m)  Wt 145 lb (65.772 kg)  BMI 23.41 kg/m2  SpO2 100%  Physical Exam General: Well-developed, well-nourished female in no acute distress; appearance consistent with age of record HENT: normocephalic; atraumatic Eyes: pupils equal, round and reactive to light; extraocular muscles intact Neck: supple Heart: regular rate and rhythm Lungs: clear to auscultation bilaterally Abdomen: soft; nondistended; nontender; no masses or hepatosplenomegaly; bowel sounds present Extremities: No deformity; full range of motion Neurologic: Awake, alert and  oriented; motor function intact in all extremities and symmetric; no facial droop Skin: Warm and dry Psychiatric: Normal mood and affect    ED Course  Procedures (including critical care time)  MDM  3:07 AM Pain relieved, patient ready to go home after IV fluids and IV medications.    Wynetta Fines, MD 09/23/13 [phone]

## 2013-09-23 NOTE — ED Notes (Signed)
[facility] since 9 pm with n/v

## 2013-11-01 ENCOUNTER — Ambulatory Visit (INDEPENDENT_AMBULATORY_CARE_PROVIDER_SITE_OTHER): Payer: 59 | Admitting: Psychology

## 2013-11-01 DIAGNOSIS — F4323 Adjustment disorder with mixed anxiety and depressed mood: Secondary | ICD-10-CM

## 2013-12-13 ENCOUNTER — Ambulatory Visit (INDEPENDENT_AMBULATORY_CARE_PROVIDER_SITE_OTHER): Payer: 59 | Admitting: Psychology

## 2013-12-13 DIAGNOSIS — F4323 Adjustment disorder with mixed anxiety and depressed mood: Secondary | ICD-10-CM | POA: Diagnosis not present

## 2014-01-03 ENCOUNTER — Ambulatory Visit (INDEPENDENT_AMBULATORY_CARE_PROVIDER_SITE_OTHER): Payer: 59 | Admitting: Psychology

## 2014-01-03 DIAGNOSIS — F4323 Adjustment disorder with mixed anxiety and depressed mood: Secondary | ICD-10-CM

## 2014-02-14 ENCOUNTER — Ambulatory Visit: Payer: No Typology Code available for payment source | Admitting: Psychology

## 2014-10-21 ENCOUNTER — Encounter: Payer: Self-pay | Admitting: Gastroenterology

## 2014-10-24 LAB — HM MAMMOGRAPHY: HM MAMMO: NORMAL

## 2014-11-27 ENCOUNTER — Other Ambulatory Visit: Payer: Self-pay | Admitting: Obstetrics and Gynecology

## 2014-11-27 DIAGNOSIS — N644 Mastodynia: Secondary | ICD-10-CM

## 2014-12-08 ENCOUNTER — Ambulatory Visit (INDEPENDENT_AMBULATORY_CARE_PROVIDER_SITE_OTHER): Payer: BLUE CROSS/BLUE SHIELD | Admitting: Psychology

## 2014-12-08 DIAGNOSIS — F4323 Adjustment disorder with mixed anxiety and depressed mood: Secondary | ICD-10-CM | POA: Diagnosis not present

## 2014-12-23 ENCOUNTER — Encounter: Payer: Self-pay | Admitting: Family

## 2014-12-23 ENCOUNTER — Ambulatory Visit (INDEPENDENT_AMBULATORY_CARE_PROVIDER_SITE_OTHER): Payer: BLUE CROSS/BLUE SHIELD | Admitting: Family

## 2014-12-23 ENCOUNTER — Telehealth: Payer: Self-pay | Admitting: *Deleted

## 2014-12-23 VITALS — BP 122/88 | HR 70 | Temp 98.2°F | Resp 16 | Wt 142.2 lb

## 2014-12-23 DIAGNOSIS — J302 Other seasonal allergic rhinitis: Secondary | ICD-10-CM

## 2014-12-23 DIAGNOSIS — F329 Major depressive disorder, single episode, unspecified: Secondary | ICD-10-CM

## 2014-12-23 DIAGNOSIS — F32A Depression, unspecified: Secondary | ICD-10-CM

## 2014-12-23 DIAGNOSIS — F419 Anxiety disorder, unspecified: Principal | ICD-10-CM

## 2014-12-23 DIAGNOSIS — F418 Other specified anxiety disorders: Secondary | ICD-10-CM | POA: Diagnosis not present

## 2014-12-23 MED ORDER — RIZATRIPTAN BENZOATE 10 MG PO TABS: 10.0000 mg | ORAL_TABLET | ORAL | Status: DC | PRN

## 2014-12-23 MED ORDER — VENLAFAXINE HCL ER 75 MG PO CP24: 75.0000 mg | ORAL_CAPSULE | Freq: Every day | ORAL | Status: DC

## 2014-12-23 MED ORDER — CLONAZEPAM 0.5 MG PO TABS: 0.5000 mg | ORAL_TABLET | Freq: Three times a day (TID) | ORAL | Status: DC | PRN

## 2014-12-23 NOTE — Assessment & Plan Note (Signed)
Stable on effexor and prn klonopin.  Continue same, obtain UDS today. Controlled substance contact signed.

## 2014-12-23 NOTE — Patient Instructions (Signed)
Please go to the lab prior to leaving for Urine Drug Screen.  Please follow up in November as Scheduled.  Welcome to [facility]!

## 2014-12-23 NOTE — Progress Notes (Signed)
Subjective:    Patient ID: Whitney King, female    DOB: [birth year], 44 y.o.   MRN: [id number]  HPI  Whitney King is a 44 yr old female who presents today to establish care.   pmhx is significant for the following  Anxiety/Depression- maintained on Klonazepam and effexor xr.   Reports that had panic attacks after her dad's heart attack which occurred right before she went top college.  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  Initially on celexa for years, but eventually celexa became ineffecteive and she was switched to effexor. Reports that symptoms remain well controlled despite her recent separation from her husband. She reports rare use of klonopin for sleep.    Seasonal allergies- takes benadryl HS. Plans resume zyrtec due to recent eye itching.    Exercised induced asthma- as a child.  No adult symptoms.  Does not need inhaler.    Review of Systems  Constitutional: Negative for unexpected weight change.  HENT: Negative for hearing loss and rhinorrhea.   Eyes: Negative for visual disturbance.  Respiratory: Negative for cough.   Cardiovascular: Negative for chest pain and leg swelling.  Gastrointestinal: Negative for nausea, diarrhea and blood in stool.  Genitourinary: Negative for dysuria, frequency and menstrual problem.  Musculoskeletal: Negative for myalgias and arthralgias.  Skin: Negative for rash.  Neurological: Negative for headaches.  Hematological: Negative for adenopathy.  Psychiatric/Behavioral:       See HPI     Past Medical History  Diagnosis Date  . PONV (postoperative nausea and vomiting)   . SVD (spontaneous vaginal delivery)     x 1  . Anxiety   . Mental disorder   . Seasonal allergies   . GERD (gastroesophageal reflux disease)     diet controlled only  . Headache(784.0)     otc meds prn  . Asthma   . Hypertension   . Migraines     Social History   Social History  . Marital Status: Married    Spouse Name: N/A    . Number of Children: N/A  . Years of Education: N/A   Occupational History  . Not on file.   Social History Main Topics  . Smoking status: Current Some Day Smoker -- 0.05 packs/day for 20 years    Types: Cigarettes  . Smokeless tobacco: Former Systems developer     Comment: smokes socially  . Alcohol Use: 2.4 oz/week    4 Glasses of wine per week     Comment: socially  . Drug Use: No  . Sexual Activity: Yes    Birth Control/ Protection: None   Other Topics Concern  . Not on file   Social History Narrative   Separated   2 children   Works as an Engineer, structural PA   Enjoys gym, kayak, paddle, boarding, outside activities    Past Surgical History  Procedure Laterality Date  . Right ankle surgery  1996    RECONSTRUCTION  . Left wrist surgery  2012    fracture repair  . Wisdom tooth extraction    . Eye surgery      [place] - bilateral eyes  . Left leg vein surgery    . Dilitation & currettage/hystroscopy with novasure ablation N/A 10/17/2012    Procedure: DILATATION & CURETTAGE/HYSTEROSCOPY WITH NOVASURE ABLATION;  Surgeon: Daria Pastures, MD;  Location: [facility];  Service: Gynecology;  Laterality: N/A;  . Rectocele repair N/A 10/17/2012    Procedure: POSTERIOR REPAIR (RECTOCELE);  Surgeon: Daria Pastures, MD;  Location: [facility];  Service: Gynecology;  Laterality: N/A;    Family History  Problem Relation Age of Onset  . Cancer Mother     colon  . Hyperlipidemia Mother   . Hypertension Mother   . Cancer Father     colon  . Heart disease Father   . Hypertension Father   . Stroke Paternal Grandfather     Allergies  Allergen Reactions  . Erythromycin Diarrhea    Current Outpatient Prescriptions on File Prior to Visit  Medication Sig Dispense Refill  . cetirizine (ZYRTEC) 10 MG tablet Take 10 mg by mouth daily as needed for allergies.    . clonazePAM (KLONOPIN) 0.5 MG tablet Take 0.5 mg by mouth as needed for anxiety.    . diphenhydrAMINE (BENADRYL) 50 MG tablet  Take 50 mg by mouth at bedtime as needed for itching or sleep.    . rizatriptan (MAXALT) 10 MG tablet Take 10 mg by mouth as needed for migraine. May repeat in 2 hours if needed    . venlafaxine XR (EFFEXOR-XR) 75 MG 24 hr capsule Take 75 mg by mouth daily.     No current facility-administered medications on file prior to visit.    BP 122/88 mmHg  Pulse 70  Temp(Src) 98.2 F (36.8 C) (Oral)  Resp 16  Wt 142 lb 3.2 oz (64.501 kg)  SpO2 100%       Objective:   Physical Exam  Constitutional: She is oriented to person, place, and time. She appears well-developed and well-nourished.  HENT:  Head: Normocephalic and atraumatic.  Eyes: No scleral icterus.  Neck: No thyromegaly present.  Cardiovascular: Normal rate, regular rhythm and normal heart sounds.   No murmur heard. Pulmonary/Chest: Effort normal and breath sounds normal. No respiratory distress. She has no wheezes.  Lymphadenopathy:    She has no cervical adenopathy.  Neurological: She is alert and oriented to person, place, and time.  Skin: Skin is warm and dry.  Psychiatric: She has a normal mood and affect. Her behavior is normal. Judgment and thought content normal.          Assessment & Plan:

## 2014-12-23 NOTE — Telephone Encounter (Signed)
E-transmission to pharmacy for [place] failed and was called to the pharmacist this morning.

## 2014-12-23 NOTE — Progress Notes (Signed)
Pre visit review using our clinic review tool, if applicable. No additional management support is needed unless otherwise documented below in the visit note. 

## 2014-12-23 NOTE — Assessment & Plan Note (Signed)
she will start zyrtec.

## 2015-01-02 ENCOUNTER — Ambulatory Visit (INDEPENDENT_AMBULATORY_CARE_PROVIDER_SITE_OTHER): Payer: BLUE CROSS/BLUE SHIELD | Admitting: Psychology

## 2015-01-02 DIAGNOSIS — F4323 Adjustment disorder with mixed anxiety and depressed mood: Secondary | ICD-10-CM

## 2015-01-05 ENCOUNTER — Encounter: Payer: Self-pay | Admitting: Gastroenterology

## 2015-01-05 ENCOUNTER — Ambulatory Visit (INDEPENDENT_AMBULATORY_CARE_PROVIDER_SITE_OTHER): Payer: BLUE CROSS/BLUE SHIELD | Admitting: Gastroenterology

## 2015-01-05 VITALS — BP 122/80 | HR 82 | Ht 66.0 in | Wt 141.0 lb

## 2015-01-05 DIAGNOSIS — Z800 Family history of malignant neoplasm of digestive organs: Secondary | ICD-10-CM

## 2015-01-05 DIAGNOSIS — K589 Irritable bowel syndrome without diarrhea: Secondary | ICD-10-CM

## 2015-01-05 MED ORDER — NA SULFATE-K SULFATE-MG SULF 17.5-3.13-1.6 GM/177ML PO SOLN: ORAL | Status: DC

## 2015-01-05 NOTE — Progress Notes (Signed)
HPI: This is a very pleasant 44 year old woman    who was referred to me by Debbrah Alar to evaluate  strong family history of colon cancer, IBS symptoms .    Chief complaint is chronic IBS symptoms, strong family history of colon cancer  Mother and father with colon cancer.  She has had IBS in the past, never sees blood in the stool.  Stress related issues cramping, diarrhea.  Can be constipated at times. She eats a lot of fiber in her diet and this can sometimes make things worse, sometimes make things better.  Separating.  She eats well, has tried fiber supplement.  Rectocele repair 2014; this has helped her   Review of systems: Pertinent positive and negative review of systems were noted in the above HPI section. Complete review of systems was performed and was otherwise normal.   Past Medical History  Diagnosis Date  . PONV (postoperative nausea and vomiting)   . SVD (spontaneous vaginal delivery)     x 1  . Anxiety   . Mental disorder   . Seasonal allergies   . GERD (gastroesophageal reflux disease)     diet controlled only  . Headache(784.0)     otc meds prn  . Asthma   . Hypertension   . Migraines     Past Surgical History  Procedure Laterality Date  . Right ankle surgery  1996    RECONSTRUCTION  . Left wrist surgery  2012    fracture repair  . Wisdom tooth extraction    . Eye surgery      [place] - bilateral eyes  . Left leg vein surgery    . Dilitation & currettage/hystroscopy with novasure ablation N/A 10/17/2012    Procedure: DILATATION & CURETTAGE/HYSTEROSCOPY WITH NOVASURE ABLATION;  Surgeon: Daria Pastures, MD;  Location: [facility];  Service: Gynecology;  Laterality: N/A;  . Rectocele repair N/A 10/17/2012    Procedure: POSTERIOR REPAIR (RECTOCELE);  Surgeon: Daria Pastures, MD;  Location: [facility];  Service: Gynecology;  Laterality: N/A;    Current Outpatient Prescriptions  Medication Sig Dispense Refill  . cetirizine (ZYRTEC) 10 MG tablet  Take 10 mg by mouth daily as needed for allergies.    . clonazePAM (KLONOPIN) 0.5 MG tablet Take 1 tablet (0.5 mg total) by mouth 3 (three) times daily as needed for anxiety. 30 tablet 0  . diphenhydrAMINE (BENADRYL) 50 MG tablet Take 50 mg by mouth at bedtime as needed for itching or sleep.    . rizatriptan (MAXALT) 10 MG tablet Take 1 tablet (10 mg total) by mouth as needed for migraine. May repeat in 2 hours if needed 10 tablet 5  . venlafaxine XR (EFFEXOR-XR) 75 MG 24 hr capsule Take 1 capsule (75 mg total) by mouth daily. 90 capsule 1   No current facility-administered medications for this visit.    Allergies as of 01/05/2015 - Review Complete 01/05/2015  Allergen Reaction Noted  . Erythromycin Diarrhea 12/23/2014    Family History  Problem Relation Age of Onset  . Colon cancer Mother   . Hyperlipidemia Mother   . Hypertension Mother   . Colon cancer Father   . Heart disease Father   . Hypertension Father   . Stroke Paternal Grandfather     Social History   Social History  . Marital Status: Married    Spouse Name: N/A  . Number of Children: 2  . Years of Education: N/A   Occupational History  . 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  [employer]   Social History Main Topics  . Smoking status: Current Some Day Smoker -- 0.05 packs/day for 20 years    Types: Cigarettes  . Smokeless tobacco: Former Systems developer     Comment: smokes socially  . Alcohol Use: 2.4 oz/week    4 Glasses of wine per week     Comment: socially  . Drug Use: No  . Sexual Activity: Yes    Birth Control/ Protection: None   Other Topics Concern  . Not on file   Social History Narrative   Separated   2 children   Works as an Engineer, structural PA   Enjoys gym, kayak, paddle, boarding, outside activities     Physical Exam: Ht 5\' 6"  (1.676 m)  Wt 141 lb (63.957 kg)  BMI 22.77 kg/m2 Constitutional: generally well-appearing Psychiatric: alert and oriented x3 Eyes: extraocular movements intact Mouth: oral  pharynx moist, no lesions Neck: supple no lymphadenopathy Cardiovascular: heart regular rate and rhythm Lungs: clear to auscultation bilaterally Abdomen: soft, nontender, nondistended, no obvious ascites, no peritoneal signs, normal bowel sounds Extremities: no lower extremity edema bilaterally Skin: no lesions on visible extremities   Assessment and plan: 44 y.o. female with  chronic IBS-like symptoms, strong family history of colon cancer  Both her mother and her father both had colon cancer. She needs colon cancer screening with colonoscopy at her soonest convenience and if it is normal she will probably still need colonoscopy screening every 5 years. I did recommend a trial of Citrucel fiber supplements as this can help even out alternating bowel habits often quite well. I see no reason for any further blood tests or imaging studies prior to then.   Owens Loffler, MD [facility] 01/05/2015, 8:31 AM  Cc: Debbrah Alar

## 2015-01-05 NOTE — Patient Instructions (Addendum)
You will be set up for a colonoscopy for screening (elevated risk due to family history of colon cancer). Please start taking citrucel (orange flavored) powder fiber supplement.  This may cause some bloating at first but that usually goes away. Begin with a small spoonful and work your way up to a large, heaping spoonful daily over a week.

## 2015-02-06 ENCOUNTER — Ambulatory Visit: Payer: BLUE CROSS/BLUE SHIELD | Admitting: Psychology

## 2015-02-09 ENCOUNTER — Ambulatory Visit (AMBULATORY_SURGERY_CENTER): Payer: BLUE CROSS/BLUE SHIELD | Admitting: Gastroenterology

## 2015-02-09 ENCOUNTER — Encounter: Payer: Self-pay | Admitting: Gastroenterology

## 2015-02-09 VITALS — BP 134/85 | HR 60 | Temp 98.9°F | Resp 20 | Ht 66.0 in | Wt 141.0 lb

## 2015-02-09 DIAGNOSIS — Z1211 Encounter for screening for malignant neoplasm of colon: Secondary | ICD-10-CM

## 2015-02-09 DIAGNOSIS — Z800 Family history of malignant neoplasm of digestive organs: Secondary | ICD-10-CM

## 2015-02-09 DIAGNOSIS — D122 Benign neoplasm of ascending colon: Secondary | ICD-10-CM

## 2015-02-09 MED ORDER — SODIUM CHLORIDE 0.9 % IV SOLN: 500.0000 mL | INTRAVENOUS | Status: DC

## 2015-02-09 NOTE — Patient Instructions (Signed)
YOU HAD AN ENDOSCOPIC PROCEDURE TODAY AT [facility]:   Refer to the procedure report that was given to you for any specific questions about what was found during the examination.  If the procedure report does not answer your questions, please call your gastroenterologist to clarify.  If you requested that your care partner not be given the details of your procedure findings, then the procedure report has been included in a sealed envelope for you to review at your convenience later.  YOU SHOULD EXPECT: Some feelings of bloating in the abdomen. Passage of more gas than usual.  Walking can help get rid of the air that was put into your GI tract during the procedure and reduce the bloating. If you had a lower endoscopy (such as a colonoscopy or flexible sigmoidoscopy) you may notice spotting of blood in your stool or on the toilet paper. If you underwent a bowel prep for your procedure, you may not have a normal bowel movement for a few days.  Please Note:  You might notice some irritation and congestion in your nose or some drainage.  This is from the oxygen used during your procedure.  There is no need for concern and it should clear up in a day or so.  SYMPTOMS TO REPORT IMMEDIATELY:   Following lower endoscopy (colonoscopy or flexible sigmoidoscopy):  Excessive amounts of blood in the stool  Significant tenderness or worsening of abdominal pains  Swelling of the abdomen that is new, acute  Fever of 100F or higher   For urgent or emergent issues, a gastroenterologist can be reached at any hour by calling [phone].   DIET: Your first meal following the procedure should be a small meal and then it is ok to progress to your normal diet. Heavy or fried foods are harder to digest and may make you feel nauseous or bloated.  Likewise, meals heavy in dairy and vegetables can increase bloating.  Drink plenty of fluids but you should avoid alcoholic beverages for 24  hours.  ACTIVITY:  You should plan to take it easy for the rest of today and you should NOT DRIVE or use heavy machinery until tomorrow (because of the sedation medicines used during the test).    FOLLOW UP: Our staff will call the number listed on your records the next business day following your procedure to check on you and address any questions or concerns that you may have regarding the information given to you following your procedure. If we do not reach you, we will leave a message.  However, if you are feeling well and you are not experiencing any problems, there is no need to return our call.  We will assume that you have returned to your regular daily activities without incident.  If any biopsies were taken you will be contacted by phone or by letter within the next 1-3 weeks.  Please call us at [phone] if you have not heard about the biopsies in 3 weeks.    SIGNATURES/CONFIDENTIALITY: You and/or your care partner have signed paperwork which will be entered into your electronic medical record.  These signatures attest to the fact that that the information above on your After Visit Summary has been reviewed and is understood.  Full responsibility of the confidentiality of this discharge information lies with you and/or your care-partner.  The doctor still wants you to have genetic testing as discussed.

## 2015-02-09 NOTE — Progress Notes (Signed)
Report to PACU, RN, vss, BBS= Clear.  

## 2015-02-09 NOTE — Op Note (Signed)
[facility]  [address]. [place], [id number]   COLONOSCOPY PROCEDURE REPORT  PATIENT: Whitney King, Whitney King  MR#: [id number] BIRTHDATE: 1971-04-01 , 43  yrs. old GENDER: female ENDOSCOPIST: Milus Banister, MD REFERRED CX:KGYJEHU O'Sullivan, FNP PROCEDURE DATE:  02/09/2015 PROCEDURE:   Colonoscopy, screening and Colonoscopy with snare polypectomy First Screening Colonoscopy - Avg.  risk and is 50 yrs.  old or older Yes.  Prior Negative Screening - Now for repeat screening. N/A  History of Adenoma - Now for follow-up colonoscopy & has been > or = to 3 yrs.  N/A  Polyps removed today? Yes ASA CLASS:   Class II INDICATIONS:Screening for colonic neoplasia, FH Colon or Rectal Adenocarcinoma, and mother and father both with colon cancer. MEDICATIONS: Monitored anesthesia care and Propofol 300 mg IV  DESCRIPTION OF PROCEDURE:   After the risks benefits and alternatives of the procedure were thoroughly explained, informed consent was obtained.  The digital rectal exam revealed no abnormalities of the rectum.   The [id number]  endoscope was introduced through the anus and advanced to the cecum, which was identified by both the appendix and ileocecal valve. No adverse events experienced.   The quality of the prep was excellent.  The instrument was then slowly withdrawn as the colon was fully examined. Estimated blood loss is zero unless otherwise noted in this procedure report.   COLON FINDINGS: A sessile polyp measuring 4 mm in size was found in the ascending colon.  A polypectomy was performed with a cold snare.  The resection was complete, the polyp tissue was completely retrieved and sent to histology.   The examination was otherwise normal.  Retroflexed views revealed no abnormalities. The time to cecum = 4.1 Withdrawal time = 9.6   The scope was withdrawn and the procedure completed. COMPLICATIONS: There were no immediate complications.  ENDOSCOPIC  IMPRESSION: 1.   Sessile polyp was found in the ascending colon; polypectomy was performed with a cold snare 2.   The examination was otherwise normal  RECOMMENDATIONS: 1. Given your significant family history of colon cancer, you should have a repeat colonoscopy in 5 years even if the polyp removed today is NOT a precancerous polyp.  You will receive a letter within 1-2 weeks with the results of your biopsy as well as final recommendations.  Please call my office if you have not received a letter after 3 weeks. 2. Dr. Ardis Hughs' office will arrange referral to [facility] at [facility] given your significant FH of colon cancer (? Lynch Syndrome in the family).  eSigned:  Milus Banister, MD 02/09/2015 3:11 PM

## 2015-02-09 NOTE — Progress Notes (Signed)
Called to room to assist during endoscopic procedure.  Patient ID and intended procedure confirmed with present staff. Received instructions for my participation in the procedure from the performing physician.  

## 2015-02-10 ENCOUNTER — Telehealth: Payer: Self-pay | Admitting: *Deleted

## 2015-02-10 NOTE — Telephone Encounter (Signed)
Pt states she will be flying to [place] on Thursday and is requesting Lorrin Mais to help her with her sleep so she isn't fighting jet lag.  States she usually takes 2 during the flight over and 2 during the flight back. She is requesting 4 tablets be called to [facility] on [address].  Please advise?

## 2015-02-10 NOTE — Telephone Encounter (Signed)
  Follow up Call-  Call back number 02/09/2015  Post procedure Call Back phone  # [phone]  Permission to leave phone message Yes     Patient questions:  Do you have a fever, pain , or abdominal swelling? No. Pain Score  0 *  Have you tolerated food without any problems? Yes.    Have you been able to return to your normal activities? Yes.    Do you have any questions about your discharge instructions: Diet   No. Medications  No. Follow up visit  No.  Do you have questions or concerns about your Care? No.  Actions: * If pain score is 4 or above: No action needed, pain <4.

## 2015-02-11 MED ORDER — ZOLPIDEM TARTRATE 5 MG PO TABS: ORAL_TABLET | ORAL | Status: DC

## 2015-02-11 NOTE — Telephone Encounter (Signed)
OK, please call in rx as pended below. Thanks.

## 2015-02-11 NOTE — Telephone Encounter (Signed)
Rx called to pharmacy voicemail as below and pt has been notified.

## 2015-02-16 ENCOUNTER — Encounter: Payer: Self-pay | Admitting: Gastroenterology

## 2015-03-16 ENCOUNTER — Encounter: Payer: Self-pay | Admitting: Family

## 2015-03-16 ENCOUNTER — Ambulatory Visit (INDEPENDENT_AMBULATORY_CARE_PROVIDER_SITE_OTHER): Payer: BLUE CROSS/BLUE SHIELD | Admitting: Family

## 2015-03-16 VITALS — BP 148/100 | HR 65 | Temp 98.1°F | Resp 16 | Ht 65.5 in | Wt 141.0 lb

## 2015-03-16 DIAGNOSIS — F418 Other specified anxiety disorders: Secondary | ICD-10-CM

## 2015-03-16 DIAGNOSIS — F329 Major depressive disorder, single episode, unspecified: Secondary | ICD-10-CM

## 2015-03-16 DIAGNOSIS — I10 Essential (primary) hypertension: Secondary | ICD-10-CM

## 2015-03-16 DIAGNOSIS — J302 Other seasonal allergic rhinitis: Secondary | ICD-10-CM

## 2015-03-16 DIAGNOSIS — G43909 Migraine, unspecified, not intractable, without status migrainosus: Secondary | ICD-10-CM | POA: Insufficient documentation

## 2015-03-16 DIAGNOSIS — G43809 Other migraine, not intractable, without status migrainosus: Secondary | ICD-10-CM

## 2015-03-16 DIAGNOSIS — F419 Anxiety disorder, unspecified: Secondary | ICD-10-CM

## 2015-03-16 DIAGNOSIS — F32A Depression, unspecified: Secondary | ICD-10-CM

## 2015-03-16 HISTORY — DX: Essential (primary) hypertension: I10

## 2015-03-16 MED ORDER — HYDROCHLOROTHIAZIDE 25 MG PO TABS: 25.0000 mg | ORAL_TABLET | Freq: Every day | ORAL | Status: DC

## 2015-03-16 MED ORDER — CLONAZEPAM 0.5 MG PO TABS: 0.5000 mg | ORAL_TABLET | Freq: Three times a day (TID) | ORAL | Status: DC | PRN

## 2015-03-16 NOTE — Patient Instructions (Signed)
Please schedule a lab draw tomorrow or Wednesday. Start HCTZ for blood pressure. Follow a low sodium diet.

## 2015-03-16 NOTE — Assessment & Plan Note (Addendum)
BP is up today.  Will add hctz. She will return for BMET.  Follow up in 2 weeks for nurse visit and follow up bmet on hctz.

## 2015-03-16 NOTE — Assessment & Plan Note (Signed)
Stable, monitor.  °

## 2015-03-16 NOTE — Progress Notes (Signed)
Subjective:    Patient ID: Whitney King, female    DOB: November 22, 1970, 44 y.o.   MRN: [id number]  HPI  Whitney King is a 44 yr old female who presents today for follow up.   Seasonal allergies- improved with zyrtec.  Anxiety/depression- reports stable on current meds.   HTN- Reports hx of pre-eclampsia and was on BP meds for several years after giving birth.   BP Readings from Last 3 Encounters:  03/16/15 148/100  02/09/15 134/85  01/05/15 122/80   Migraines- reports no recent migraines   Review of Systems See HPI  Past Medical History  Diagnosis Date  . PONV (postoperative nausea and vomiting)   . SVD (spontaneous vaginal delivery)     x 1  . Anxiety   . Mental disorder   . Seasonal allergies   . GERD (gastroesophageal reflux disease)     diet controlled only  . Headache(784.0)     otc meds prn  . Asthma   . Hypertension   . Migraines     Social History   Social History  . Marital Status: Married    Spouse Name: N/A  . Number of Children: 2  . Years of Education: N/A   Occupational History  . 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     [employer]   Social History Main Topics  . Smoking status: Current Some Day Smoker -- 0.05 packs/day for 20 years    Types: Cigarettes  . Smokeless tobacco: Former Systems developer     Comment: smokes socially  . Alcohol Use: 2.4 oz/week    4 Glasses of wine per week     Comment: socially  . Drug Use: No  . Sexual Activity: Yes    Birth Control/ Protection: None   Other Topics Concern  . Not on file   Social History Narrative   Separated   2 children   Works as an Engineer, structural PA   Enjoys gym, kayak, paddle, boarding, outside activities    Past Surgical History  Procedure Laterality Date  . Right ankle surgery  1996    RECONSTRUCTION  . Left wrist surgery  2012    fracture repair  . Wisdom tooth extraction    . Eye surgery      [place] - bilateral eyes  . Left leg vein surgery    . Dilitation & currettage/hystroscopy with  novasure ablation N/A 10/17/2012    Procedure: DILATATION & CURETTAGE/HYSTEROSCOPY WITH NOVASURE ABLATION;  Surgeon: Daria Pastures, MD;  Location: [facility];  Service: Gynecology;  Laterality: N/A;  . Rectocele repair N/A 10/17/2012    Procedure: POSTERIOR REPAIR (RECTOCELE);  Surgeon: Daria Pastures, MD;  Location: [facility];  Service: Gynecology;  Laterality: N/A;    Family History  Problem Relation Age of Onset  . Colon cancer Mother   . Hyperlipidemia Mother   . Hypertension Mother   . Colon cancer Father   . Heart disease Father   . Hypertension Father   . Stroke Paternal Grandfather     Allergies  Allergen Reactions  . Erythromycin Diarrhea    Current Outpatient Prescriptions on File Prior to Visit  Medication Sig Dispense Refill  . cetirizine (ZYRTEC) 10 MG tablet Take 10 mg by mouth daily as needed for allergies.    . clonazePAM (KLONOPIN) 0.5 MG tablet Take 1 tablet (0.5 mg total) by mouth 3 (three) times daily as needed for anxiety. 30 tablet 0  . diphenhydrAMINE (BENADRYL) 50 MG tablet Take 50 mg by mouth at bedtime  as needed for itching or sleep.    . rizatriptan (MAXALT) 10 MG tablet Take 1 tablet (10 mg total) by mouth as needed for migraine. May repeat in 2 hours if needed 10 tablet 5  . venlafaxine XR (EFFEXOR-XR) 75 MG 24 hr capsule Take 1 capsule (75 mg total) by mouth daily. 90 capsule 1   No current facility-administered medications on file prior to visit.    BP 148/100 mmHg  Pulse 65  Temp(Src) 98.1 F (36.7 C) (Oral)  Resp 16  Ht 5' 5.5" (1.664 m)  Wt 141 lb (63.957 kg)  BMI 23.10 kg/m2  SpO2 100%       Objective:   Physical Exam  Constitutional: She is oriented to person, place, and time. She appears well-developed and well-nourished.  Cardiovascular: Normal rate, regular rhythm and normal heart sounds.   No murmur heard. Pulmonary/Chest: Effort normal and breath sounds normal. No respiratory distress. She has no wheezes.  Musculoskeletal:  She exhibits no edema.  Neurological: She is alert and oriented to person, place, and time.  Psychiatric: She has a normal mood and affect. Her behavior is normal. Judgment and thought content normal.          Assessment & Plan:  She is interested in non-hormonal birth control. We discussed paraguard. Advised pt to schedule an appointment with her OB-GYN to discuss.

## 2015-03-16 NOTE — Assessment & Plan Note (Signed)
Improved on zyrtec, continue same.

## 2015-03-16 NOTE — Progress Notes (Signed)
Pre visit review using our clinic review tool, if applicable. No additional management support is needed unless otherwise documented below in the visit note. 

## 2015-03-16 NOTE — Assessment & Plan Note (Signed)
Stable on effexor and prn clonazepam.

## 2015-03-18 ENCOUNTER — Other Ambulatory Visit: Payer: BLUE CROSS/BLUE SHIELD

## 2015-06-20 ENCOUNTER — Other Ambulatory Visit: Payer: Self-pay | Admitting: Family

## 2015-07-02 ENCOUNTER — Other Ambulatory Visit: Payer: Self-pay | Admitting: Obstetrics and Gynecology

## 2015-07-09 NOTE — Patient Instructions (Signed)
Your procedure is scheduled on:  Wednesday, July 15, 2015  Enter through the [employer] [facility] at:  11:15 AM  Pick up the phone at the desk and dial [phone].  Call this number if you have problems the morning of surgery: [phone].  Remember: Do NOT eat food:  After Midnight Tuesday  Do NOT drink clear liquids after:  8:30 AM day of surgery  Take these medicines the morning of surgery with a SIP OF WATER:  Clonazepam, Effexor  Do NOT wear jewelry (body piercing), metal hair clips/bobby pins, make-up, or nail polish. Do NOT wear lotions, powders, or perfumes.  You may wear deoderant. Do NOT shave for 48 hours prior to surgery. Do NOT bring valuables to the hospital. Contacts, dentures, or bridgework may not be worn into surgery.  Have a responsible adult drive you home and stay with you for 24 hours after your procedure

## 2015-07-10 ENCOUNTER — Encounter (HOSPITAL_COMMUNITY)
Admission: RE | Admit: 2015-07-10 | Discharge: 2015-07-10 | Disposition: A | Discharge status: Home / Self Care | Payer: BLUE CROSS/BLUE SHIELD | Source: Ambulatory Visit | Attending: Obstetrics and Gynecology | Admitting: Obstetrics and Gynecology

## 2015-07-10 ENCOUNTER — Encounter (HOSPITAL_COMMUNITY): Payer: Self-pay

## 2015-07-10 DIAGNOSIS — Z01812 Encounter for preprocedural laboratory examination: Secondary | ICD-10-CM | POA: Diagnosis not present

## 2015-07-10 LAB — CBC
HCT: 40 % (ref 36.0–46.0)
Hemoglobin: 13.9 g/dL (ref 12.0–15.0)
MCH: 31.7 pg (ref 26.0–34.0)
MCHC: 34.8 g/dL (ref 30.0–36.0)
MCV: 91.3 fL (ref 78.0–100.0)
PLATELETS: 179 10*3/uL (ref 150–400)
RBC: 4.38 MIL/uL (ref 3.87–5.11)
RDW: 13.1 % (ref 11.5–15.5)
WBC: 6.5 10*3/uL (ref 4.0–10.5)

## 2015-07-10 LAB — TYPE AND SCREEN
ABO/RH(D): A POS
ANTIBODY SCREEN: NEGATIVE

## 2015-07-10 LAB — ABO/RH: ABO/RH(D): A POS

## 2015-07-14 NOTE — H&P (Signed)
45 y.o. yo s/p Novasure now changed partners (previous had vasectomy) and now need permanent birth control.  Past Medical History  Diagnosis Date  . PONV (postoperative nausea and vomiting)   . SVD (spontaneous vaginal delivery)     x 1  . Anxiety   . Mental disorder   . Seasonal allergies   . GERD (gastroesophageal reflux disease)     diet controlled only  . Headache(784.0)     otc meds prn  . Migraines   . Hypertension     no medications at this time  . Asthma     exercise induced, allergies, no problems since allergy shots   Past Surgical History  Procedure Laterality Date  . Right ankle surgery  1996    RECONSTRUCTION  . Left wrist surgery  2012    fracture repair  . Wisdom tooth extraction    . Eye surgery      [place] - bilateral eyes  . Dilitation & currettage/hystroscopy with novasure ablation N/A 10/17/2012    Procedure: DILATATION & CURETTAGE/HYSTEROSCOPY WITH NOVASURE ABLATION;  Surgeon: Daria Pastures, MD;  Location: [facility];  Service: Gynecology;  Laterality: N/A;  . Rectocele repair N/A 10/17/2012    Procedure: POSTERIOR REPAIR (RECTOCELE);  Surgeon: Daria Pastures, MD;  Location: [facility];  Service: Gynecology;  Laterality: N/A;  . Left leg vein surgery Bilateral     Social History   Social History  . Marital Status: Legally Separated    Spouse Name: N/A  . Number of Children: 2  . Years of Education: N/A   Occupational History  . 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     [employer]   Social History Main Topics  . Smoking status: Current Some Day Smoker -- 0.05 packs/day for 20 years    Types: Cigarettes  . Smokeless tobacco: Never Used     Comment: smokes socially  . Alcohol Use: 2.4 oz/week    4 Glasses of wine per week     Comment: socially  . Drug Use: No  . Sexual Activity: Yes    Birth Control/ Protection: None   Other Topics Concern  . Not on file   Social History Narrative   Separated   2 children   Works as an Engineer, structural PA   Enjoys gym,  kayak, paddle, boarding, outside activities    No current facility-administered medications on file prior to encounter.   Current Outpatient Prescriptions on File Prior to Encounter  Medication Sig Dispense Refill  . cetirizine (ZYRTEC) 10 MG tablet Take 10 mg by mouth daily as needed for allergies.    . clonazePAM (KLONOPIN) 0.5 MG tablet Take 1 tablet (0.5 mg total) by mouth 3 (three) times daily as needed for anxiety. 30 tablet 0  . diphenhydrAMINE (BENADRYL) 50 MG tablet Take 50 mg by mouth at bedtime as needed for itching or sleep.    . rizatriptan (MAXALT) 10 MG tablet Take 1 tablet (10 mg total) by mouth as needed for migraine. May repeat in 2 hours if needed 10 tablet 5    Allergies  Allergen Reactions  . Erythromycin Diarrhea    @VITALS2 @  Lungs: clear to ascultation Cor:  RRR Abdomen:  soft, nontender, nondistended. Ex:  no cords, erythema Pelvic:  NEFG, normal s/s uterus and no masses.  A:  Pt desires permanent sterilization after Novasure ablation.   P: All risks, benefits and alternatives d/w patient and she desires to proceed.  Patient will get SCDs during the operation.  Adea Geisel A

## 2015-07-15 ENCOUNTER — Encounter (HOSPITAL_COMMUNITY)
Admission: RE | Disposition: A | Discharge status: Home / Self Care | Payer: Self-pay | Source: Ambulatory Visit | Attending: Obstetrics and Gynecology

## 2015-07-15 ENCOUNTER — Ambulatory Visit (HOSPITAL_COMMUNITY)
Admission: RE | Admit: 2015-07-15 | Discharge: 2015-07-15 | Disposition: A | Discharge status: Home / Self Care | Payer: BLUE CROSS/BLUE SHIELD | Source: Ambulatory Visit | Attending: Obstetrics and Gynecology | Admitting: Obstetrics and Gynecology

## 2015-07-15 ENCOUNTER — Ambulatory Visit (HOSPITAL_COMMUNITY): Payer: BLUE CROSS/BLUE SHIELD | Admitting: Anesthesiology

## 2015-07-15 DIAGNOSIS — Z881 Allergy status to other antibiotic agents status: Secondary | ICD-10-CM | POA: Insufficient documentation

## 2015-07-15 DIAGNOSIS — F17210 Nicotine dependence, cigarettes, uncomplicated: Secondary | ICD-10-CM | POA: Diagnosis not present

## 2015-07-15 DIAGNOSIS — I10 Essential (primary) hypertension: Secondary | ICD-10-CM | POA: Diagnosis not present

## 2015-07-15 DIAGNOSIS — Z302 Encounter for sterilization: Secondary | ICD-10-CM | POA: Insufficient documentation

## 2015-07-15 DIAGNOSIS — Z79899 Other long term (current) drug therapy: Secondary | ICD-10-CM | POA: Insufficient documentation

## 2015-07-15 DIAGNOSIS — F419 Anxiety disorder, unspecified: Secondary | ICD-10-CM | POA: Insufficient documentation

## 2015-07-15 DIAGNOSIS — J45909 Unspecified asthma, uncomplicated: Secondary | ICD-10-CM | POA: Diagnosis not present

## 2015-07-15 DIAGNOSIS — K219 Gastro-esophageal reflux disease without esophagitis: Secondary | ICD-10-CM | POA: Diagnosis not present

## 2015-07-15 HISTORY — PX: LAPAROSCOPIC TUBAL LIGATION: SHX1937

## 2015-07-15 LAB — PREGNANCY, URINE: PREG TEST UR: NEGATIVE

## 2015-07-15 SURGERY — LIGATION, FALLOPIAN TUBE, LAPAROSCOPIC
Anesthesia: General | Site: Abdomen | Laterality: Bilateral

## 2015-07-15 MED ORDER — PROPOFOL 500 MG/50ML IV EMUL
INTRAVENOUS | Status: DC | PRN
  Administered 2015-07-15: 150 ug/kg/min via INTRAVENOUS

## 2015-07-15 MED ORDER — BUPIVACAINE HCL (PF) 0.25 % IJ SOLN
INTRAMUSCULAR | Status: AC
  Filled 2015-07-15: qty 30

## 2015-07-15 MED ORDER — ROCURONIUM BROMIDE 100 MG/10ML IV SOLN
INTRAVENOUS | Status: DC | PRN
  Administered 2015-07-15: 30 mg via INTRAVENOUS

## 2015-07-15 MED ORDER — PROPOFOL 10 MG/ML IV BOLUS
INTRAVENOUS | Status: AC
  Filled 2015-07-15: qty 20

## 2015-07-15 MED ORDER — FENTANYL CITRATE (PF) 100 MCG/2ML IJ SOLN
25.0000 ug | INTRAMUSCULAR | Status: DC | PRN
  Administered 2015-07-15 (×3): 25 ug via INTRAVENOUS
  Administered 2015-07-15: 50 ug via INTRAVENOUS

## 2015-07-15 MED ORDER — KETOROLAC TROMETHAMINE 30 MG/ML IJ SOLN
INTRAMUSCULAR | Status: AC
  Filled 2015-07-15: qty 1

## 2015-07-15 MED ORDER — SCOPOLAMINE 1 MG/3DAYS TD PT72
1.0000 | MEDICATED_PATCH | Freq: Once | TRANSDERMAL | Status: DC
  Administered 2015-07-15: 1.5 mg via TRANSDERMAL

## 2015-07-15 MED ORDER — FENTANYL CITRATE (PF) 100 MCG/2ML IJ SOLN
INTRAMUSCULAR | Status: AC
  Filled 2015-07-15: qty 2

## 2015-07-15 MED ORDER — PROPOFOL 500 MG/50ML IV EMUL
INTRAVENOUS | Status: AC
  Filled 2015-07-15: qty 50

## 2015-07-15 MED ORDER — SUGAMMADEX SODIUM 200 MG/2ML IV SOLN
INTRAVENOUS | Status: AC
  Filled 2015-07-15: qty 2

## 2015-07-15 MED ORDER — MIDAZOLAM HCL 2 MG/2ML IJ SOLN
INTRAMUSCULAR | Status: AC
  Filled 2015-07-15: qty 2

## 2015-07-15 MED ORDER — LIDOCAINE HCL (CARDIAC) 20 MG/ML IV SOLN
INTRAVENOUS | Status: DC | PRN
  Administered 2015-07-15: 100 mg via INTRAVENOUS

## 2015-07-15 MED ORDER — SCOPOLAMINE 1 MG/3DAYS TD PT72
MEDICATED_PATCH | TRANSDERMAL | Status: AC
  Administered 2015-07-15: 1.5 mg via TRANSDERMAL
  Filled 2015-07-15: qty 1

## 2015-07-15 MED ORDER — LIDOCAINE HCL (CARDIAC) 20 MG/ML IV SOLN
INTRAVENOUS | Status: AC
  Filled 2015-07-15: qty 5

## 2015-07-15 MED ORDER — FENTANYL CITRATE (PF) 100 MCG/2ML IJ SOLN
INTRAMUSCULAR | Status: AC
  Administered 2015-07-15: 25 ug via INTRAVENOUS
  Filled 2015-07-15: qty 2

## 2015-07-15 MED ORDER — SUGAMMADEX SODIUM 500 MG/5ML IV SOLN
INTRAVENOUS | Status: AC
  Filled 2015-07-15: qty 5

## 2015-07-15 MED ORDER — LACTATED RINGERS IV SOLN
INTRAVENOUS | Status: DC
  Administered 2015-07-15 (×2): via INTRAVENOUS

## 2015-07-15 MED ORDER — ONDANSETRON HCL 4 MG/2ML IJ SOLN
INTRAMUSCULAR | Status: AC
  Filled 2015-07-15: qty 2

## 2015-07-15 MED ORDER — GLYCOPYRROLATE 0.2 MG/ML IJ SOLN
INTRAMUSCULAR | Status: AC
  Filled 2015-07-15: qty 3

## 2015-07-15 MED ORDER — OXYCODONE-ACETAMINOPHEN 5-325 MG PO TABS
1.0000 | ORAL_TABLET | ORAL | Status: DC | PRN
  Administered 2015-07-15: 1 via ORAL

## 2015-07-15 MED ORDER — ONDANSETRON HCL 4 MG/2ML IJ SOLN
INTRAMUSCULAR | Status: DC | PRN
  Administered 2015-07-15: 4 mg via INTRAVENOUS

## 2015-07-15 MED ORDER — SUGAMMADEX SODIUM 200 MG/2ML IV SOLN
INTRAVENOUS | Status: DC | PRN
  Administered 2015-07-15: 136.2 mg via INTRAVENOUS

## 2015-07-15 MED ORDER — FENTANYL CITRATE (PF) 100 MCG/2ML IJ SOLN
INTRAMUSCULAR | Status: DC | PRN
  Administered 2015-07-15 (×2): 50 ug via INTRAVENOUS
  Administered 2015-07-15: 100 ug via INTRAVENOUS
  Administered 2015-07-15: 50 ug via INTRAVENOUS

## 2015-07-15 MED ORDER — FENTANYL CITRATE (PF) 250 MCG/5ML IJ SOLN
INTRAMUSCULAR | Status: AC
  Filled 2015-07-15: qty 5

## 2015-07-15 MED ORDER — OXYCODONE-ACETAMINOPHEN 5-325 MG PO TABS: 1.0000 | ORAL_TABLET | ORAL | Status: DC | PRN

## 2015-07-15 MED ORDER — MIDAZOLAM HCL 2 MG/2ML IJ SOLN
INTRAMUSCULAR | Status: DC | PRN
  Administered 2015-07-15: 2 mg via INTRAVENOUS

## 2015-07-15 MED ORDER — DEXAMETHASONE SODIUM PHOSPHATE 4 MG/ML IJ SOLN
INTRAMUSCULAR | Status: AC
  Filled 2015-07-15: qty 1

## 2015-07-15 MED ORDER — OXYCODONE-ACETAMINOPHEN 5-325 MG PO TABS
ORAL_TABLET | ORAL | Status: AC
  Filled 2015-07-15: qty 1

## 2015-07-15 MED ORDER — KETOROLAC TROMETHAMINE 30 MG/ML IJ SOLN
INTRAMUSCULAR | Status: DC | PRN
  Administered 2015-07-15: 30 mg via INTRAVENOUS

## 2015-07-15 MED ORDER — SUCCINYLCHOLINE CHLORIDE 20 MG/ML IJ SOLN
INTRAMUSCULAR | Status: DC | PRN
  Administered 2015-07-15: 100 mg via INTRAVENOUS

## 2015-07-15 MED ORDER — ONDANSETRON HCL 8 MG PO TABS: 8.0000 mg | ORAL_TABLET | Freq: Three times a day (TID) | ORAL | Status: DC | PRN

## 2015-07-15 MED ORDER — DEXAMETHASONE SODIUM PHOSPHATE 10 MG/ML IJ SOLN
INTRAMUSCULAR | Status: DC | PRN
  Administered 2015-07-15: 4 mg via INTRAVENOUS

## 2015-07-15 MED ORDER — PROPOFOL 10 MG/ML IV BOLUS
INTRAVENOUS | Status: DC | PRN
  Administered 2015-07-15: 160 mg via INTRAVENOUS

## 2015-07-15 MED ORDER — BUPIVACAINE HCL 0.25 % IJ SOLN
INTRAMUSCULAR | Status: DC | PRN
  Administered 2015-07-15: 30 mL

## 2015-07-15 MED ORDER — ONDANSETRON HCL 4 MG/2ML IJ SOLN: 4.0000 mg | Freq: Once | INTRAMUSCULAR | Status: DC | PRN

## 2015-07-15 MED ORDER — ROCURONIUM BROMIDE 100 MG/10ML IV SOLN
INTRAVENOUS | Status: AC
  Filled 2015-07-15: qty 1

## 2015-07-15 MED ORDER — NEOSTIGMINE METHYLSULFATE 10 MG/10ML IV SOLN
INTRAVENOUS | Status: AC
  Filled 2015-07-15: qty 1

## 2015-07-15 SURGICAL SUPPLY — 25 items
CATH ROBINSON RED A/P 16FR (CATHETERS) ×2 IMPLANT
CHLORAPREP W/TINT 26ML (MISCELLANEOUS) ×2 IMPLANT
CLIP FILSHIE TUBAL LIGA STRL (Clip) ×4 IMPLANT
CLOTH BEACON ORANGE TIMEOUT ST (SAFETY) ×2 IMPLANT
DRSG COVADERM PLUS 2X2 (GAUZE/BANDAGES/DRESSINGS) ×4 IMPLANT
DRSG OPSITE POSTOP 3X4 (GAUZE/BANDAGES/DRESSINGS) ×2 IMPLANT
DURAPREP 26ML APPLICATOR (WOUND CARE) ×2 IMPLANT
GLOVE BIO SURGEON STRL SZ7 (GLOVE) ×2 IMPLANT
GLOVE BIOGEL PI IND STRL 7.0 (GLOVE) ×2 IMPLANT
GLOVE BIOGEL PI INDICATOR 7.0 (GLOVE) ×2
GLOVE SURG SS PI 7.0 STRL IVOR (GLOVE) ×2 IMPLANT
GOWN STRL REUS W/TWL LRG LVL3 (GOWN DISPOSABLE) ×4 IMPLANT
LIQUID BAND (GAUZE/BANDAGES/DRESSINGS) ×2 IMPLANT
NEEDLE INSUFFLATION 120MM (ENDOMECHANICALS) ×2 IMPLANT
PACK LAPAROSCOPY BASIN (CUSTOM PROCEDURE TRAY) ×2 IMPLANT
PAD TRENDELENBURG POSITION (MISCELLANEOUS) ×2 IMPLANT
SLEEVE XCEL OPT CAN 5 100 (ENDOMECHANICALS) IMPLANT
SUT VIC AB 2-0 UR5 27 (SUTURE) ×2 IMPLANT
SUT VICRYL RAPIDE 3 0 (SUTURE) IMPLANT
SYR CONTROL 10ML LL (SYRINGE) ×2 IMPLANT
TOWEL OR 17X24 6PK STRL BLUE (TOWEL DISPOSABLE) ×4 IMPLANT
TROCAR XCEL DIL TIP R 11M (ENDOMECHANICALS) ×2 IMPLANT
TROCAR XCEL NON-BLD 5MMX100MML (ENDOMECHANICALS) IMPLANT
WARMER LAPAROSCOPE (MISCELLANEOUS) ×2 IMPLANT
WATER STERILE IRR 1000ML POUR (IV SOLUTION) ×2 IMPLANT

## 2015-07-15 NOTE — Anesthesia Postprocedure Evaluation (Signed)
Anesthesia Post Note  Patient: Whitney King  Procedure(s) Performed: Procedure(s) (LRB): LAPAROSCOPIC TUBAL LIGATION (Bilateral)  Patient location during evaluation: PACU Anesthesia Type: General Level of consciousness: awake Pain management: pain level controlled Vital Signs Assessment: post-procedure vital signs reviewed and stable Respiratory status: spontaneous breathing Cardiovascular status: blood pressure returned to baseline and stable Postop Assessment: no signs of nausea or vomiting Anesthetic complications: no    Last Vitals:  Filed Vitals:   07/15/15 1630 07/15/15 1637  BP: 159/95   Pulse: 66   Temp:    Resp: 15 13    Last Pain:  Filed Vitals:   07/15/15 1654  PainSc: [place]

## 2015-07-15 NOTE — Transfer of Care (Signed)
Immediate Anesthesia Transfer of Care Note  Patient: Whitney King  Procedure(s) Performed: Procedure(s) with comments: LAPAROSCOPIC TUBAL LIGATION (Bilateral) - FILSHIE CLIPS  Patient Location: PACU  Anesthesia Type:General  Level of Consciousness: awake, alert  and oriented  Airway & Oxygen Therapy: Patient Spontanous Breathing and Patient connected to nasal cannula oxygen  Post-op Assessment: Report given to RN, Post -op Vital signs reviewed and stable and Patient moving all extremities  Post vital signs: Reviewed and stable  Last Vitals:  Filed Vitals:   07/15/15 1125 07/15/15 1128  BP:  155/84  Pulse: 70   Temp: 37 C   Resp: 20     Complications: No apparent anesthesia complications

## 2015-07-15 NOTE — Discharge Instructions (Signed)
DISCHARGE INSTRUCTIONS: Laparoscopy  The following instructions have been prepared to help you care for yourself upon your return home today.  Wound care:  Do not get the incision wet for the first 24 hours. The incision should be kept clean and dry.  The Band-Aids or dressings may be removed the day after surgery.  Should the incision become sore, red, and swollen after the first week, check with your doctor.  Personal hygiene:  Shower the day after your procedure.  Activity and limitations:  Do NOT drive or operate any equipment today.  Do NOT lift anything more than 15 pounds for 2-3 weeks after surgery.  Do NOT rest in bed all day.  Walking is encouraged. Walk each day, starting slowly with 5-minute walks 3 or 4 times a day. Slowly increase the length of your walks.  Walk up and down stairs slowly.  Do NOT do strenuous activities, such as golfing, playing tennis, bowling, running, biking, weight lifting, gardening, mowing, or vacuuming for 2-4 weeks. Ask your doctor when it is okay to start.  Diet: Eat a light meal as desired this evening. You may resume your usual diet tomorrow.  Return to work: This is dependent on the type of work you do. For the most part you can return to a desk job within a week of surgery. If you are more active at work, please discuss this with your doctor.  What to expect after your surgery: You may have a slight burning sensation when you urinate on the first day. You may have a very small amount of blood in the urine. Expect to have a small amount of vaginal discharge/light bleeding for 1-2 weeks. It is not unusual to have abdominal soreness and bruising for up to 2 weeks. You may be tired and need more rest for about 1 week. You may experience shoulder pain for 24-72 hours. Lying flat in bed may relieve it.  Call your doctor for any of the following:  Develop a fever of 100.4 or greater  Inability to urinate 6 hours after discharge from  hospital  Severe pain not relieved by pain medications Post Anesthesia Home Care Instructions  Activity: Get plenty of rest for the remainder of the day. A responsible adult should stay with you for 24 hours following the procedure.  For the next 24 hours, DO NOT: -Drive a car -Paediatric nurse -Drink alcoholic beverages -Take any medication unless instructed by your physician -Make any legal decisions or sign important papers.  Meals: Start with liquid foods such as gelatin or soup. Progress to regular foods as tolerated. Avoid greasy, spicy, heavy foods. If nausea and/or vomiting occur, drink only clear liquids until the nausea and/or vomiting subsides. Call your physician if vomiting continues.  Special Instructions/Symptoms: Your throat may feel dry or sore from the anesthesia or the breathing tube placed in your throat during surgery. If this causes discomfort, gargle with warm salt water. The discomfort should disappear within 24 hours.  If you had a scopolamine patch placed behind your ear for the management of post- operative nausea and/or vomiting:  1. The medication in the patch is effective for 72 hours, after which it should be removed.  Wrap patch in a tissue and discard in the trash. Wash hands thoroughly with soap and water. 2. You may remove the patch earlier than 72 hours if you experience unpleasant side effects which may include dry mouth, dizziness or visual disturbances. 3. Avoid touching the patch. Wash your hands with  soap and water after contact with the patch.     Persistent of heavy bleeding at incision site  Redness or swelling around incision site after a week  Increasing nausea or vomiting  Patient Signature________________________________________ Nurse Signature_________________________________________

## 2015-07-15 NOTE — Brief Op Note (Signed)
07/15/2015  3:48 PM  PATIENT:  Whitney King  45 y.o. female  PRE-OPERATIVE DIAGNOSIS:  STERILIZATION  POST-OPERATIVE DIAGNOSIS: same  PROCEDURE:  Procedure(s) with comments: LAPAROSCOPIC TUBAL LIGATION (Bilateral) - FILSHIE CLIPS  SURGEON:  Surgeon(s) and Role:    * Bobbye Charleston, MD - Primary  ANESTHESIA:   general  EBL:   min   SPECIMEN:  No Specimen  DISPOSITION OF SPECIMEN:  PATHOLOGY  COUNTS:  YES  TOURNIQUET:  * No tourniquets in log *  DICTATION: .Note written in EPIC  PLAN OF CARE: Discharge to home after PACU  PATIENT DISPOSITION:  PACU - hemodynamically stable.   Delay start of Pharmacological VTE agent (>24hrs) due to surgical blood loss or risk of bleeding: not applicable  Meds: none  Complications:  None.  Findings: normal size uterus,normal tubes and ovaries.  Small plaque of endometriosis in cul de sac.    Technique  After adequate general endotracheal anesthesia was achieved, the patient was prepped and draped in the usual sterile fashion.  A 2 cm incision was made just below the umbilicus and the abdominal wall tented up.  The Veress needle was inserted at a 45 degree angle to the pelvis and no bowel contents or blood were aspirated.  The abdomen was insufflated and the 12 mm trocar placed without complication.  The operative scope was introduced and the above findings noted.  The filchie clip instrument was introduced and the tube were followed to their fimbriated ends bilaterally.  A filchie clip was placed on the isthmic portion of each tube and confirmed visually to be around the entire circumference of each tube.  After a quick inspection of the pelvis and liver edge, all instruments were removed and the abdomen was desufflated.  The 12 mm faschial incision was closed with a figure of eight stitch of 2-vicryl and the skin was closed with dermabond.  All instruments were removed from the vagina and the patient returned to the PACU in stable  condition.  [name]

## 2015-07-15 NOTE — Op Note (Signed)
07/15/2015  3:48 PM  PATIENT:  Whitney King  45 y.o. female  PRE-OPERATIVE DIAGNOSIS:  STERILIZATION  POST-OPERATIVE DIAGNOSIS: same  PROCEDURE:  Procedure(s) with comments: LAPAROSCOPIC TUBAL LIGATION (Bilateral) - FILSHIE CLIPS  SURGEON:  Surgeon(s) and Role:    * Bobbye Charleston, MD - Primary  ANESTHESIA:   general  EBL:   min   SPECIMEN:  No Specimen  DISPOSITION OF SPECIMEN:  PATHOLOGY  COUNTS:  YES  TOURNIQUET:  * No tourniquets in log *  DICTATION: .Note written in EPIC  PLAN OF CARE: Discharge to home after PACU  PATIENT DISPOSITION:  PACU - hemodynamically stable.   Delay start of Pharmacological VTE agent (>24hrs) due to surgical blood loss or risk of bleeding: not applicable  Meds: none  Complications:  None.  Findings: normal size uterus,normal tubes and ovaries.  Small plaque of endometriosis in cul de sac.    Technique  After adequate general endotracheal anesthesia was achieved, the patient was prepped and draped in the usual sterile fashion.  A 2 cm incision was made just below the umbilicus and the abdominal wall tented up.  The Veress needle was inserted at a 45 degree angle to the pelvis and no bowel contents or blood were aspirated.  The abdomen was insufflated and the 12 mm trocar placed without complication.  The operative scope was introduced and the above findings noted.  The filchie clip instrument was introduced and the tube were followed to their fimbriated ends bilaterally.  A filchie clip was placed on the isthmic portion of each tube and confirmed visually to be around the entire circumference of each tube.  After a quick inspection of the pelvis and liver edge, all instruments were removed and the abdomen was desufflated.  The 12 mm faschial incision was closed with a figure of eight stitch of 2-vicryl and the skin was closed with dermabond.  All instruments were removed from the vagina and the patient returned to the PACU in stable  condition.  Adriaan Maltese A

## 2015-07-15 NOTE — Anesthesia Preprocedure Evaluation (Signed)
Anesthesia Evaluation  Patient identified by MRN, date of birth, ID band Patient awake    Reviewed: Allergy & Precautions, NPO status , Patient's Chart, lab work & pertinent test results  History of Anesthesia Complications (+) PONV and history of anesthetic complications  Airway Mallampati: II  TM Distance: >3 FB Neck ROM: Full    Dental no notable dental hx. (+) Dental Advisory Given   Pulmonary asthma , Current Smoker,    Pulmonary exam normal breath sounds clear to auscultation       Cardiovascular hypertension, Pt. on medications Normal cardiovascular exam Rhythm:Regular Rate:Normal     Neuro/Psych  Headaches, PSYCHIATRIC DISORDERS Anxiety    GI/Hepatic Neg liver ROS, GERD  Medicated and Controlled,  Endo/Other  negative endocrine ROS  Renal/GU negative Renal ROS  negative genitourinary   Musculoskeletal negative musculoskeletal ROS (+)   Abdominal   Peds negative pediatric ROS (+)  Hematology negative hematology ROS (+)   Anesthesia Other Findings   Reproductive/Obstetrics negative OB ROS                             Anesthesia Physical Anesthesia Plan  ASA: II  Anesthesia Plan: General   Post-op Pain Management:    Induction: Intravenous  Airway Management Planned: Oral ETT  Additional Equipment:   Intra-op Plan:   Post-operative Plan: Extubation in OR  Informed Consent: I have reviewed the patients History and Physical, chart, labs and discussed the procedure including the risks, benefits and alternatives for the proposed anesthesia with the patient or authorized representative who has indicated his/her understanding and acceptance.   Dental advisory given  Plan Discussed with: CRNA  Anesthesia Plan Comments:         Anesthesia Quick Evaluation

## 2015-07-15 NOTE — Anesthesia Procedure Notes (Signed)
Procedure Name: Intubation Date/Time: 07/15/2015 3:23 PM Performed by: Hewitt Blade Pre-anesthesia Checklist: Patient identified, Emergency Drugs available, Suction available and Patient being monitored Patient Re-evaluated:Patient Re-evaluated prior to inductionOxygen Delivery Method: Circle system utilized Preoxygenation: Pre-oxygenation with 100% oxygen Intubation Type: IV induction Laryngoscope Size: Mac and 3 Grade View: Grade I Tube type: Oral Tube size: 7.0 mm Number of attempts: 1 Airway Equipment and Method: Stylet Placement Confirmation: ETT inserted through vocal cords under direct vision,  positive ETCO2 and breath sounds checked- equal and bilateral Secured at: 21 cm Tube secured with: Tape Dental Injury: Teeth and Oropharynx as per pre-operative assessment

## 2015-07-15 NOTE — Progress Notes (Signed)
There has been no change in the patients history, status or exam since the history and physical.  Filed Vitals:   07/15/15 1125 07/15/15 1128  BP:  155/84  Pulse: 70   Temp: 98.6 F (37 C)   TempSrc: Oral   Resp: 20   SpO2: 100%     Lab Results  Component Value Date   WBC 6.5 07/10/2015   HGB 13.9 07/10/2015   HCT 40.0 07/10/2015   MCV 91.3 07/10/2015   PLT 179 07/10/2015    Whitney King A

## 2015-07-16 ENCOUNTER — Encounter (HOSPITAL_COMMUNITY): Payer: Self-pay | Admitting: Obstetrics and Gynecology

## 2015-08-10 ENCOUNTER — Telehealth: Payer: Self-pay | Admitting: Behavioral Health

## 2015-08-10 NOTE — Telephone Encounter (Signed)
Pt returned your call.  

## 2015-08-10 NOTE — Telephone Encounter (Signed)
Unable to reach patient at time of Pre-Visit Call.  Left message for patient to return call when available.    

## 2015-08-10 NOTE — Telephone Encounter (Signed)
LM x 2 for call back.

## 2015-08-11 ENCOUNTER — Ambulatory Visit (INDEPENDENT_AMBULATORY_CARE_PROVIDER_SITE_OTHER): Payer: BLUE CROSS/BLUE SHIELD | Admitting: Family

## 2015-08-11 ENCOUNTER — Encounter: Payer: Self-pay | Admitting: Family

## 2015-08-11 VITALS — BP 135/84 | HR 74 | Temp 99.0°F | Resp 16 | Ht 65.75 in | Wt 150.0 lb

## 2015-08-11 DIAGNOSIS — Z0001 Encounter for general adult medical examination with abnormal findings: Secondary | ICD-10-CM | POA: Diagnosis not present

## 2015-08-11 DIAGNOSIS — L708 Other acne: Secondary | ICD-10-CM | POA: Diagnosis not present

## 2015-08-11 DIAGNOSIS — Z0000 Encounter for general adult medical examination without abnormal findings: Secondary | ICD-10-CM

## 2015-08-11 MED ORDER — DOXYCYCLINE HYCLATE 20 MG PO TABS: 20.0000 mg | ORAL_TABLET | Freq: Two times a day (BID) | ORAL | Status: DC

## 2015-08-11 MED ORDER — VENLAFAXINE HCL ER 75 MG PO CP24: ORAL_CAPSULE | ORAL | Status: DC

## 2015-08-11 MED ORDER — RIZATRIPTAN BENZOATE 10 MG PO TABS: 10.0000 mg | ORAL_TABLET | ORAL | Status: DC | PRN

## 2015-08-11 MED FILL — DOXYCYCLINE HYC 20 MG TAB: 20 | 45 days supply | Qty: 90 | Fill #0 | Status: TO

## 2015-08-11 MED FILL — RIZATRIPTAN 10 MG TABLET: 10 | 30 days supply | Qty: 10 | Fill #0

## 2015-08-11 MED FILL — clonazePAM 0.5 MG TABS: 0.5 | 10 days supply | Qty: 30 | Fill #0

## 2015-08-11 NOTE — Assessment & Plan Note (Signed)
Restart doxy. Advised pt to let me know if scabbed lesion does not resolve in a few weeks on the doxy. If so, we will plan referrral to another dermatologist.

## 2015-08-11 NOTE — Progress Notes (Signed)
Pre visit review using our clinic review tool, if applicable. No additional management support is needed unless otherwise documented below in the visit note. 

## 2015-08-11 NOTE — Patient Instructions (Signed)
Follow up in 3 months so we can recheck your blood pressure. Please bring [place] copies of your lab work from [facility].

## 2015-08-11 NOTE — Assessment & Plan Note (Signed)
She will bring [place] labs from her GYN.  Immunizations reviewed and up to date. Pap, mammo up to date.

## 2015-08-11 NOTE — Progress Notes (Signed)
Subjective:    Patient ID: Whitney King, female    DOB: [birth year], 45 y.o.   MRN: [id number]  HPI   Patient presents today for complete physical.  Immunizations: tetanus 2011 Diet: healthy Colonoscopy: 10/16- recommended 5 yr follow up. Pap Smear:08/16/13- normal Mammogram: 10/24/14 Vision: up to date Dental: up to date  BP Readings from Last 3 Encounters:  08/11/15 135/84  07/15/15 158/95  07/10/15 137/99   Acne- reports that derm had her on doxy 20mg  once daily which really helped her skin. That dermatologist is no longer accepting her insurance.  She would like a refill.    Review of Systems  Constitutional: Negative for unexpected weight change.  HENT: Negative for hearing loss and rhinorrhea.   Eyes: Negative for visual disturbance.  Respiratory: Negative for cough and shortness of breath.   Cardiovascular: Negative for leg swelling.  Gastrointestinal: Negative for diarrhea, constipation and blood in stool.  Genitourinary: Negative for dysuria and frequency.  Musculoskeletal: Negative for myalgias and arthralgias.  Skin:       Acne breakout  Neurological:       Rare migraines  Hematological: Negative for adenopathy.  Psychiatric/Behavioral:       Mood is improved   Past Medical History  Diagnosis Date  . PONV (postoperative nausea and vomiting)   . SVD (spontaneous vaginal delivery)     x 1  . Anxiety   . Mental disorder   . Seasonal allergies   . GERD (gastroesophageal reflux disease)     diet controlled only  . Headache(784.0)     otc meds prn  . Migraines   . Hypertension     no medications at this time  . Asthma     exercise induced, allergies, no problems since allergy shots     Social History   Social History  . Marital Status: Legally Separated    Spouse Name: N/A  . Number of Children: 2  . Years of Education: N/A   Occupational History  . 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     [employer]   Social History Main Topics  . Smoking status: Former  Smoker -- 0.05 packs/day for 20 years    Types: Cigarettes    Quit date: 02/10/2015  . Smokeless tobacco: Never Used  . Alcohol Use: 2.4 oz/week    4 Glasses of wine per week     Comment: socially  . Drug Use: No  . Sexual Activity: Yes    Birth Control/ Protection: None   Other Topics Concern  . Not on file   Social History Narrative   Separated   2 children   Works as an Engineer, structural PA   Enjoys gym, kayak, paddle, boarding, outside activities    Past Surgical History  Procedure Laterality Date  . Right ankle surgery  1996    RECONSTRUCTION  . Left wrist surgery  2012    fracture repair  . Wisdom tooth extraction    . Eye surgery      [place] - bilateral eyes  . Dilitation & currettage/hystroscopy with novasure ablation N/A 10/17/2012    Procedure: DILATATION & CURETTAGE/HYSTEROSCOPY WITH NOVASURE ABLATION;  Surgeon: Daria Pastures, MD;  Location: [facility];  Service: Gynecology;  Laterality: N/A;  . Rectocele repair N/A 10/17/2012    Procedure: POSTERIOR REPAIR (RECTOCELE);  Surgeon: Daria Pastures, MD;  Location: [facility];  Service: Gynecology;  Laterality: N/A;  . Left leg vein surgery Bilateral   . Laparoscopic tubal ligation Bilateral 07/15/2015  Procedure: LAPAROSCOPIC TUBAL LIGATION;  Surgeon: Bobbye Charleston, MD;  Location: [facility];  Service: Gynecology;  Laterality: Bilateral;  FILSHIE CLIPS    Family History  Problem Relation Age of Onset  . Colon cancer Mother   . Hyperlipidemia Mother   . Hypertension Mother   . Colon cancer Father   . Heart disease Father   . Hypertension Father   . Stroke Paternal Grandfather   . Hypertension Maternal Grandmother     Allergies  Allergen Reactions  . Erythromycin Diarrhea    Current Outpatient Prescriptions on File Prior to Visit  Medication Sig Dispense Refill  . cetirizine (ZYRTEC) 10 MG tablet Take 10 mg by mouth daily as needed for allergies.    . clonazePAM (KLONOPIN) 0.5 MG tablet Take 1 tablet  (0.5 mg total) by mouth 3 (three) times daily as needed for anxiety. 30 tablet 0  . diphenhydrAMINE (BENADRYL) 50 MG tablet Take 50 mg by mouth at bedtime as needed for itching or sleep.    . fluticasone (FLONASE) 50 MCG/ACT nasal spray Place 1 spray into both nostrils daily as needed for allergies or rhinitis.    Marland Kitchen ibuprofen (ADVIL,MOTRIN) 200 MG tablet Take 800 mg by mouth every 6 (six) hours as needed for headache or mild pain.    Marland Kitchen ondansetron (ZOFRAN) 8 MG tablet Take 1 tablet (8 mg total) by mouth every 8 (eight) hours as needed for nausea or vomiting. 20 tablet 0  . oxyCODONE-acetaminophen (ROXICET) 5-325 MG tablet Take 1 tablet by mouth every 4 (four) hours as needed for severe pain. 30 tablet 0  . rizatriptan (MAXALT) 10 MG tablet Take 1 tablet (10 mg total) by mouth as needed for migraine. May repeat in 2 hours if needed 10 tablet 5  . venlafaxine XR (EFFEXOR-XR) 75 MG 24 hr capsule TAKE 1 CAPSULE(75 MG) BY MOUTH DAILY 90 capsule 1   No current facility-administered medications on file prior to visit.    BP 135/84 mmHg  Pulse 74  Temp(Src) 99 F (37.2 C) (Oral)  Resp 16  Ht 5' 5.75" (1.67 m)  Wt 150 lb (68.04 kg)  BMI 24.40 kg/m2  SpO2 100%       Objective:   Physical Exam Physical Exam  Constitutional: She is oriented to person, place, and time. She appears well-developed and well-nourished. No distress.  HENT:  Head: Normocephalic and atraumatic.  Right Ear: Tympanic membrane and ear canal normal.  Left Ear: Tympanic membrane and ear canal normal.  Mouth/Throat: Oropharynx is clear and moist.  Eyes: Pupils are equal, round, and reactive to light. No scleral icterus.  Neck: Normal range of motion. No thyromegaly present.  Cardiovascular: Normal rate and regular rhythm.   No murmur heard. Pulmonary/Chest: Effort normal and breath sounds normal. No respiratory distress. He has no wheezes. She has no rales. She exhibits no tenderness.  Abdominal: Soft. Bowel sounds are  normal. He exhibits no distension and no mass. There is no tenderness. There is no rebound and no guarding.  Musculoskeletal: She exhibits no edema.  Lymphadenopathy:    She has no cervical adenopathy.  Neurological: She is alert and oriented to person, place, and time. She has 3+ bilateral patellar reflexes. She exhibits normal muscle tone. Coordination normal.  Skin: Skin is warm and dry. acne noted on face, some scabbing noted right cheek near nare Psychiatric: She has a normal mood and affect. Her behavior is normal. Judgment and thought content normal.  Breast/pelvic: deferred.  Assessment & Plan:          Assessment & Plan:  Declines EKG today

## 2015-10-23 ENCOUNTER — Other Ambulatory Visit: Payer: Self-pay | Admitting: Orthopedic Surgery

## 2015-10-26 ENCOUNTER — Other Ambulatory Visit: Payer: Self-pay | Admitting: Orthopedic Surgery

## 2015-10-26 ENCOUNTER — Encounter (HOSPITAL_COMMUNITY): Payer: Self-pay | Admitting: *Deleted

## 2015-10-26 NOTE — H&P (Signed)
PREOPERATIVE H&P  Chief Complaint: left leg pain  HPI: Whitney King is a 45 y.o. female who presents with ongoing pain in the left leg.  At this point, her pain has not present for about 1 month, and is severe and constant.  MRI reveals a prominent left-sided L4-L5 facet cyst, significantly compressing the left L5 nerve  Patient has failed multiple forms of conservative care and continues to have pain (see office notes for additional details regarding the patient's full course of treatment)  Past Medical History  Diagnosis Date  . PONV (postoperative nausea and vomiting)   . SVD (spontaneous vaginal delivery)     x 1  . Anxiety   . Mental disorder   . Seasonal allergies   . GERD (gastroesophageal reflux disease)     diet controlled only  . Headache(784.0)     otc meds prn  . Migraines   . Hypertension     no medications at this time  . Asthma     exercise induced, allergies, no problems since allergy shots   Past Surgical History  Procedure Laterality Date  . Right ankle surgery  1996    RECONSTRUCTION  . Left wrist surgery  2012    fracture repair  . Wisdom tooth extraction    . Eye surgery      [place] - bilateral eyes  . Dilitation & currettage/hystroscopy with novasure ablation N/A 10/17/2012    Procedure: DILATATION & CURETTAGE/HYSTEROSCOPY WITH NOVASURE ABLATION;  Surgeon: Daria Pastures, MD;  Location: [facility];  Service: Gynecology;  Laterality: N/A;  . Rectocele repair N/A 10/17/2012    Procedure: POSTERIOR REPAIR (RECTOCELE);  Surgeon: Daria Pastures, MD;  Location: [facility];  Service: Gynecology;  Laterality: N/A;  . Left leg vein surgery Bilateral   . Laparoscopic tubal ligation Bilateral 07/15/2015    Procedure: LAPAROSCOPIC TUBAL LIGATION;  Surgeon: Bobbye Charleston, MD;  Location: [facility];  Service: Gynecology;  Laterality: Bilateral;  FILSHIE CLIPS   Social History   Social History  . Marital Status: Legally Separated    Spouse Name: N/A  .  Number of Children: 2  . Years of Education: N/A   Occupational History  . 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     [employer]   Social History Main Topics  . Smoking status: Former Smoker -- 0.05 packs/day for 20 years    Types: Cigarettes    Quit date: 02/10/2015  . Smokeless tobacco: Never Used  . Alcohol Use: 2.4 oz/week    4 Glasses of wine per week     Comment: socially  . Drug Use: No  . Sexual Activity: Yes    Birth Control/ Protection: None   Other Topics Concern  . Not on file   Social History Narrative   Separated   2 children   Works as an Engineer, structural PA   Enjoys gym, kayak, paddle, boarding, outside activities   Family History  Problem Relation Age of Onset  . Colon cancer Mother   . Hyperlipidemia Mother   . Hypertension Mother   . Colon cancer Father   . Heart disease Father   . Hypertension Father   . Stroke Paternal Grandfather   . Hypertension Maternal Grandmother    Allergies  Allergen Reactions  . Erythromycin Diarrhea   Prior to Admission medications   Medication Sig Start Date End Date Taking? Authorizing Provider  cetirizine (ZYRTEC) 10 MG tablet Take 10 mg by mouth daily as needed for allergies.   Yes  Historical Provider, MD  clonazePAM (KLONOPIN) 0.5 MG tablet Take 1 tablet (0.5 mg total) by mouth 3 (three) times daily as needed for anxiety. 03/16/15  Yes Debbrah Alar, NP  diphenhydrAMINE (BENADRYL) 50 MG tablet Take 50 mg by mouth at bedtime as needed for sleep.    Yes Historical Provider, MD  doxycycline (PERIOSTAT) 20 MG tablet Take 1 tablet (20 mg total) by mouth 2 (two) times daily. 08/11/15  Yes Debbrah Alar, NP  methocarbamol (ROBAXIN) 500 MG tablet Take 500 mg by mouth every 8 (eight) hours as needed for muscle spasms.  10/12/15  Yes Historical Provider, MD  rizatriptan (MAXALT) 10 MG tablet Take 1 tablet (10 mg total) by mouth as needed for migraine. May repeat in 2 hours if needed 08/11/15  Yes Debbrah Alar, NP  traMADol  (ULTRAM) 50 MG tablet Take 50 mg by mouth every 6 (six) hours as needed for moderate pain.  10/20/15  Yes Historical Provider, MD  venlafaxine XR (EFFEXOR-XR) 75 MG 24 hr capsule TAKE 1 CAPSULE(75 MG) BY MOUTH DAILY 08/11/15  Yes Debbrah Alar, NP     All other systems have been reviewed and were otherwise negative with the exception of those mentioned in the HPI and as above.  Physical Exam: There were no vitals filed for this visit.  General: Alert, no acute distress Cardiovascular: No pedal edema Respiratory: No cyanosis, no use of accessory musculature Skin: No lesions in the area of chief complaint Neurologic: Sensation intact distally Psychiatric: Patient is competent for consent with normal mood and affect Lymphatic: No axillary or cervical lymphadenopathy  MUSCULOSKELETAL: The patient is noted to have a markedly positive straight leg raise on the left  Assessment/Plan: Left leg pain  Plan for Procedure(s): LUMBAR 4-5 DECOMPRESSION    Sinclair Ship, MD 10/26/2015 2:54 PM

## 2015-10-27 MED ORDER — CEFAZOLIN SODIUM-DEXTROSE 2-4 GM/100ML-% IV SOLN
2.0000 g | INTRAVENOUS | Status: AC
  Administered 2015-10-28: 2 g via INTRAVENOUS
  Filled 2015-10-27: qty 100

## 2015-10-28 ENCOUNTER — Ambulatory Visit (HOSPITAL_COMMUNITY)
Admission: RE | Admit: 2015-10-28 | Discharge: 2015-10-28 | Disposition: A | Discharge status: Home / Self Care | Payer: BLUE CROSS/BLUE SHIELD | Source: Ambulatory Visit | Attending: Orthopedic Surgery | Admitting: Orthopedic Surgery

## 2015-10-28 ENCOUNTER — Ambulatory Visit (HOSPITAL_COMMUNITY): Payer: BLUE CROSS/BLUE SHIELD

## 2015-10-28 ENCOUNTER — Encounter (HOSPITAL_COMMUNITY)
Admission: RE | Disposition: A | Discharge status: Home / Self Care | Payer: Self-pay | Source: Ambulatory Visit | Attending: Orthopedic Surgery

## 2015-10-28 ENCOUNTER — Ambulatory Visit (HOSPITAL_COMMUNITY): Payer: BLUE CROSS/BLUE SHIELD | Admitting: Anesthesiology

## 2015-10-28 ENCOUNTER — Encounter (HOSPITAL_COMMUNITY): Payer: Self-pay | Admitting: *Deleted

## 2015-10-28 DIAGNOSIS — F319 Bipolar disorder, unspecified: Secondary | ICD-10-CM | POA: Insufficient documentation

## 2015-10-28 DIAGNOSIS — Z79899 Other long term (current) drug therapy: Secondary | ICD-10-CM | POA: Diagnosis not present

## 2015-10-28 DIAGNOSIS — G9619 Other disorders of meninges, not elsewhere classified: Secondary | ICD-10-CM | POA: Diagnosis not present

## 2015-10-28 DIAGNOSIS — G43909 Migraine, unspecified, not intractable, without status migrainosus: Secondary | ICD-10-CM | POA: Insufficient documentation

## 2015-10-28 DIAGNOSIS — M5416 Radiculopathy, lumbar region: Secondary | ICD-10-CM | POA: Diagnosis not present

## 2015-10-28 DIAGNOSIS — I10 Essential (primary) hypertension: Secondary | ICD-10-CM | POA: Insufficient documentation

## 2015-10-28 DIAGNOSIS — Z87891 Personal history of nicotine dependence: Secondary | ICD-10-CM | POA: Insufficient documentation

## 2015-10-28 DIAGNOSIS — K219 Gastro-esophageal reflux disease without esophagitis: Secondary | ICD-10-CM | POA: Diagnosis not present

## 2015-10-28 DIAGNOSIS — M5410 Radiculopathy, site unspecified: Secondary | ICD-10-CM

## 2015-10-28 DIAGNOSIS — Z01818 Encounter for other preprocedural examination: Secondary | ICD-10-CM

## 2015-10-28 HISTORY — DX: Bipolar disorder, unspecified: F31.9

## 2015-10-28 HISTORY — DX: Irritable bowel syndrome, unspecified: K58.9

## 2015-10-28 HISTORY — PX: LUMBAR LAMINECTOMY/DECOMPRESSION MICRODISCECTOMY: SHX5026

## 2015-10-28 LAB — COMPREHENSIVE METABOLIC PANEL
ALBUMIN: 3.9 g/dL (ref 3.5–5.0)
ALT: 16 U/L (ref 14–54)
ANION GAP: 7 (ref 5–15)
AST: 21 U/L (ref 15–41)
Alkaline Phosphatase: 32 U/L — ABNORMAL LOW (ref 38–126)
BUN: 10 mg/dL (ref 6–20)
CHLORIDE: 106 mmol/L (ref 101–111)
CO2: 25 mmol/L (ref 22–32)
Calcium: 9.6 mg/dL (ref 8.9–10.3)
Creatinine, Ser: 0.78 mg/dL (ref 0.44–1.00)
GFR calc Af Amer: >60 mL/min (ref >60)
GFR calc non Af Amer: >60 mL/min (ref >60)
GLUCOSE: 83 mg/dL (ref 65–99)
POTASSIUM: 3.9 mmol/L (ref 3.5–5.1)
SODIUM: 138 mmol/L (ref 135–145)
TOTAL PROTEIN: 6.6 g/dL (ref 6.5–8.1)
Total Bilirubin: 0.8 mg/dL (ref 0.3–1.2)

## 2015-10-28 LAB — HCG, SERUM, QUALITATIVE: PREG SERUM: NEGATIVE

## 2015-10-28 LAB — CBC WITH DIFFERENTIAL/PLATELET
BASOS ABS: 0 10*3/uL (ref 0.0–0.1)
BASOS PCT: 0 %
EOS ABS: 0.2 10*3/uL (ref 0.0–0.7)
EOS PCT: 3 %
HCT: 44.5 % (ref 36.0–46.0)
Hemoglobin: 15.1 g/dL — ABNORMAL HIGH (ref 12.0–15.0)
Lymphocytes Relative: 31 %
Lymphs Abs: 1.9 10*3/uL (ref 0.7–4.0)
MCH: 31.7 pg (ref 26.0–34.0)
MCHC: 33.9 g/dL (ref 30.0–36.0)
MCV: 93.3 fL (ref 78.0–100.0)
MONO ABS: 0.5 10*3/uL (ref 0.1–1.0)
MONOS PCT: 9 %
NEUTROS ABS: 3.6 10*3/uL (ref 1.7–7.7)
Neutrophils Relative %: 57 %
PLATELETS: 168 10*3/uL (ref 150–400)
RBC: 4.77 MIL/uL (ref 3.87–5.11)
RDW: 13 % (ref 11.5–15.5)
WBC: 6.2 10*3/uL (ref 4.0–10.5)

## 2015-10-28 LAB — URINALYSIS, ROUTINE W REFLEX MICROSCOPIC
BILIRUBIN URINE: NEGATIVE
Glucose, UA: NEGATIVE mg/dL
Hgb urine dipstick: NEGATIVE
Ketones, ur: NEGATIVE mg/dL
LEUKOCYTES UA: NEGATIVE
NITRITE: NEGATIVE
PH: 8.5 — AB (ref 5.0–8.0)
Protein, ur: NEGATIVE mg/dL
SPECIFIC GRAVITY, URINE: 1.016 (ref 1.005–1.030)

## 2015-10-28 LAB — PROTIME-INR
INR: 1.02 (ref 0.00–1.49)
Prothrombin Time: 13.6 seconds (ref 11.6–15.2)

## 2015-10-28 LAB — APTT: APTT: 26 s (ref 24–37)

## 2015-10-28 SURGERY — LUMBAR LAMINECTOMY/DECOMPRESSION MICRODISCECTOMY
Anesthesia: General | Site: Back

## 2015-10-28 MED ORDER — POVIDONE-IODINE 7.5 % EX SOLN
Freq: Once | CUTANEOUS | Status: DC
  Filled 2015-10-28: qty 118

## 2015-10-28 MED ORDER — SURGIFOAM 100 EX MISC
CUTANEOUS | Status: DC | PRN
  Administered 2015-10-28: 20 mL via TOPICAL

## 2015-10-28 MED ORDER — SCOPOLAMINE 1 MG/3DAYS TD PT72
MEDICATED_PATCH | TRANSDERMAL | Status: AC
  Filled 2015-10-28: qty 1

## 2015-10-28 MED ORDER — DEXAMETHASONE SODIUM PHOSPHATE 10 MG/ML IJ SOLN
INTRAMUSCULAR | Status: DC | PRN
  Administered 2015-10-28: 10 mg via INTRAVENOUS

## 2015-10-28 MED ORDER — LACTATED RINGERS IV SOLN
INTRAVENOUS | Status: DC
  Administered 2015-10-28 (×2): via INTRAVENOUS

## 2015-10-28 MED ORDER — METHYLPREDNISOLONE ACETATE 40 MG/ML IJ SUSP
INTRAMUSCULAR | Status: AC
Start: 2015-10-28 — End: 2015-10-28
  Filled 2015-10-28: qty 1

## 2015-10-28 MED ORDER — MIDAZOLAM HCL 5 MG/5ML IJ SOLN
INTRAMUSCULAR | Status: DC | PRN
  Administered 2015-10-28: 2 mg via INTRAVENOUS

## 2015-10-28 MED ORDER — MIDAZOLAM HCL 2 MG/2ML IJ SOLN
INTRAMUSCULAR | Status: AC
Start: 2015-10-28 — End: 2015-10-28
  Filled 2015-10-28: qty 2

## 2015-10-28 MED ORDER — ONDANSETRON HCL 4 MG/2ML IJ SOLN
INTRAMUSCULAR | Status: DC | PRN
  Administered 2015-10-28: 4 mg via INTRAVENOUS

## 2015-10-28 MED ORDER — PROPOFOL 10 MG/ML IV BOLUS
INTRAVENOUS | Status: DC | PRN
  Administered 2015-10-28: 130 mg via INTRAVENOUS

## 2015-10-28 MED ORDER — BUPIVACAINE-EPINEPHRINE 0.25% -1:200000 IJ SOLN
INTRAMUSCULAR | Status: DC | PRN
  Administered 2015-10-28: 4 mL
  Administered 2015-10-28: 18 mL

## 2015-10-28 MED ORDER — FENTANYL CITRATE (PF) 250 MCG/5ML IJ SOLN
INTRAMUSCULAR | Status: AC
Start: 2015-10-28 — End: 2015-10-28
  Filled 2015-10-28: qty 5

## 2015-10-28 MED ORDER — LIDOCAINE HCL (CARDIAC) 20 MG/ML IV SOLN
INTRAVENOUS | Status: DC | PRN
  Administered 2015-10-28: 50 mg via INTRAVENOUS

## 2015-10-28 MED ORDER — ROCURONIUM BROMIDE 100 MG/10ML IV SOLN
INTRAVENOUS | Status: DC | PRN
  Administered 2015-10-28: 50 mg via INTRAVENOUS

## 2015-10-28 MED ORDER — FENTANYL CITRATE (PF) 250 MCG/5ML IJ SOLN
INTRAMUSCULAR | Status: AC
  Filled 2015-10-28: qty 5

## 2015-10-28 MED ORDER — GLYCOPYRROLATE 0.2 MG/ML IJ SOLN
INTRAMUSCULAR | Status: DC | PRN
  Administered 2015-10-28: 0.2 mg via INTRAVENOUS

## 2015-10-28 MED ORDER — FENTANYL CITRATE (PF) 100 MCG/2ML IJ SOLN
INTRAMUSCULAR | Status: DC | PRN
  Administered 2015-10-28 (×2): 100 ug via INTRAVENOUS
  Administered 2015-10-28 (×2): 50 ug via INTRAVENOUS

## 2015-10-28 MED ORDER — METHYLENE BLUE 0.5 % INJ SOLN
INTRAVENOUS | Status: DC | PRN
  Administered 2015-10-28: 1 mL

## 2015-10-28 MED ORDER — HYDROMORPHONE HCL 1 MG/ML IJ SOLN
0.2500 mg | INTRAMUSCULAR | Status: DC | PRN
  Administered 2015-10-28: 0.25 mg via INTRAVENOUS

## 2015-10-28 MED ORDER — HYDROMORPHONE HCL 1 MG/ML IJ SOLN
INTRAMUSCULAR | Status: AC
  Filled 2015-10-28: qty 1

## 2015-10-28 MED ORDER — SUGAMMADEX SODIUM 200 MG/2ML IV SOLN
INTRAVENOUS | Status: DC | PRN
  Administered 2015-10-28: 136 mg via INTRAVENOUS

## 2015-10-28 MED ORDER — DIPHENHYDRAMINE HCL 50 MG/ML IJ SOLN
INTRAMUSCULAR | Status: DC | PRN
  Administered 2015-10-28: 25 mg via INTRAVENOUS

## 2015-10-28 MED ORDER — HEMOSTATIC AGENTS (NO CHARGE) OPTIME
TOPICAL | Status: DC | PRN
  Administered 2015-10-28: 1 via TOPICAL

## 2015-10-28 MED ORDER — THROMBIN 20000 UNITS EX SOLR
CUTANEOUS | Status: AC
  Filled 2015-10-28: qty 20000

## 2015-10-28 MED ORDER — BUPIVACAINE-EPINEPHRINE (PF) 0.25% -1:200000 IJ SOLN
INTRAMUSCULAR | Status: AC
  Filled 2015-10-28: qty 30

## 2015-10-28 MED ORDER — 0.9 % SODIUM CHLORIDE (POUR BTL) OPTIME
TOPICAL | Status: DC | PRN
  Administered 2015-10-28 (×2): 1000 mL

## 2015-10-28 MED ORDER — METHYLENE BLUE 0.5 % INJ SOLN
INTRAVENOUS | Status: AC
  Filled 2015-10-28: qty 10

## 2015-10-28 MED ORDER — METHYLPREDNISOLONE ACETATE 40 MG/ML IJ SUSP
INTRAMUSCULAR | Status: DC | PRN
  Administered 2015-10-28: 40 mg

## 2015-10-28 SURGICAL SUPPLY — 67 items
BENZOIN TINCTURE PRP APPL 2/3 (GAUZE/BANDAGES/DRESSINGS) ×2 IMPLANT
BUR ROUND PRECISION 4.0 (BURR) ×2 IMPLANT
CANISTER SUCTION 2500CC (MISCELLANEOUS) ×2 IMPLANT
CARTRIDGE OIL MAESTRO DRILL (MISCELLANEOUS) ×1 IMPLANT
CORDS BIPOLAR (ELECTRODE) ×2 IMPLANT
COVER SURGICAL LIGHT HANDLE (MISCELLANEOUS) ×2 IMPLANT
DIFFUSER DRILL AIR PNEUMATIC (MISCELLANEOUS) ×2 IMPLANT
DRAIN CHANNEL 15F RND FF W/TCR (WOUND CARE) IMPLANT
DRAPE POUCH INSTRU U-SHP 10X18 (DRAPES) ×4 IMPLANT
DRAPE SURG 17X23 STRL (DRAPES) ×8 IMPLANT
DURAPREP 26ML APPLICATOR (WOUND CARE) ×2 IMPLANT
ELECT BLADE 4.0 EZ CLEAN MEGAD (MISCELLANEOUS) ×2
ELECT CAUTERY BLADE 6.4 (BLADE) ×2 IMPLANT
ELECT REM PT RETURN 9FT ADLT (ELECTROSURGICAL) ×2
ELECTRODE BLDE 4.0 EZ CLN MEGD (MISCELLANEOUS) ×1 IMPLANT
ELECTRODE REM PT RTRN 9FT ADLT (ELECTROSURGICAL) ×1 IMPLANT
EVACUATOR SILICONE 100CC (DRAIN) IMPLANT
FILTER STRAW FLUID ASPIR (MISCELLANEOUS) ×4 IMPLANT
GAUZE SPONGE 4X4 12PLY STRL (GAUZE/BANDAGES/DRESSINGS) ×2 IMPLANT
GAUZE SPONGE 4X4 16PLY XRAY LF (GAUZE/BANDAGES/DRESSINGS) ×4 IMPLANT
GLOVE BIO SURGEON STRL SZ7 (GLOVE) ×2 IMPLANT
GLOVE BIO SURGEON STRL SZ8 (GLOVE) ×2 IMPLANT
GLOVE BIOGEL PI IND STRL 7.0 (GLOVE) ×1 IMPLANT
GLOVE BIOGEL PI IND STRL 8 (GLOVE) ×1 IMPLANT
GLOVE BIOGEL PI INDICATOR 7.0 (GLOVE) ×1
GLOVE BIOGEL PI INDICATOR 8 (GLOVE) ×1
GOWN STRL REUS W/ TWL LRG LVL3 (GOWN DISPOSABLE) ×1 IMPLANT
GOWN STRL REUS W/ TWL XL LVL3 (GOWN DISPOSABLE) ×2 IMPLANT
GOWN STRL REUS W/TWL LRG LVL3 (GOWN DISPOSABLE) ×1
GOWN STRL REUS W/TWL XL LVL3 (GOWN DISPOSABLE) ×2
IV CATH 14GX2 1/4 (CATHETERS) ×2 IMPLANT
KIT BASIN OR (CUSTOM PROCEDURE TRAY) ×2 IMPLANT
KIT POSITION SURG JACKSON T1 (MISCELLANEOUS) ×2 IMPLANT
KIT ROOM TURNOVER OR (KITS) ×2 IMPLANT
NEEDLE 18GX1X1/2 (RX/OR ONLY) (NEEDLE) ×2 IMPLANT
NEEDLE 22X1 1/2 (OR ONLY) (NEEDLE) ×2 IMPLANT
NEEDLE HYPO 25GX1X1/2 BEV (NEEDLE) ×2 IMPLANT
NEEDLE SPNL 18GX3.5 QUINCKE PK (NEEDLE) ×4 IMPLANT
NS IRRIG 1000ML POUR BTL (IV SOLUTION) ×2 IMPLANT
OIL CARTRIDGE MAESTRO DRILL (MISCELLANEOUS) ×2
PACK LAMINECTOMY ORTHO (CUSTOM PROCEDURE TRAY) ×2 IMPLANT
PACK UNIVERSAL I (CUSTOM PROCEDURE TRAY) ×2 IMPLANT
PAD ARMBOARD 7.5X6 YLW CONV (MISCELLANEOUS) ×4 IMPLANT
PATTIES SURGICAL .5 X.5 (GAUZE/BANDAGES/DRESSINGS) IMPLANT
PATTIES SURGICAL .5 X1 (DISPOSABLE) ×2 IMPLANT
SPONGE INTESTINAL PEANUT (DISPOSABLE) ×2 IMPLANT
SPONGE SURGIFOAM ABS GEL 100 (HEMOSTASIS) ×2 IMPLANT
SPONGE SURGIFOAM ABS GEL SZ50 (HEMOSTASIS) ×2 IMPLANT
STRIP CLOSURE SKIN 1/2X4 (GAUZE/BANDAGES/DRESSINGS) ×2 IMPLANT
SURGIFLO W/THROMBIN 8M KIT (HEMOSTASIS) IMPLANT
SUT MNCRL AB 4-0 PS2 18 (SUTURE) ×2 IMPLANT
SUT VIC AB 0 CT1 18XCR BRD 8 (SUTURE) ×1 IMPLANT
SUT VIC AB 0 CT1 27 (SUTURE)
SUT VIC AB 0 CT1 27XBRD ANBCTR (SUTURE) IMPLANT
SUT VIC AB 0 CT1 8-18 (SUTURE) ×1
SUT VIC AB 1 CT1 18XCR BRD 8 (SUTURE) ×2 IMPLANT
SUT VIC AB 1 CT1 8-18 (SUTURE) ×2
SUT VIC AB 2-0 CT2 18 VCP726D (SUTURE) ×2 IMPLANT
SYR 20CC LL (SYRINGE) ×2 IMPLANT
SYR BULB IRRIGATION 50ML (SYRINGE) ×2 IMPLANT
SYR CONTROL 10ML LL (SYRINGE) ×4 IMPLANT
SYR TB 1ML 26GX3/8 SAFETY (SYRINGE) ×4 IMPLANT
SYR TB 1ML LUER SLIP (SYRINGE) ×4 IMPLANT
TOWEL OR 17X24 6PK STRL BLUE (TOWEL DISPOSABLE) ×2 IMPLANT
TOWEL OR 17X26 10 PK STRL BLUE (TOWEL DISPOSABLE) ×2 IMPLANT
WATER STERILE IRR 1000ML POUR (IV SOLUTION) IMPLANT
YANKAUER SUCT BULB TIP NO VENT (SUCTIONS) ×2 IMPLANT

## 2015-10-28 NOTE — Anesthesia Postprocedure Evaluation (Signed)
Anesthesia Post Note  Patient: Whitney King  Procedure(s) Performed: Procedure(s) (LRB): LUMBAR 4-5 DECOMPRESSION  (N/A)  Patient location during evaluation: PACU Anesthesia Type: General Level of consciousness: awake and alert Pain management: pain level controlled Vital Signs Assessment: post-procedure vital signs reviewed and stable Respiratory status: spontaneous breathing, nonlabored ventilation and respiratory function stable Cardiovascular status: blood pressure returned to baseline and stable Postop Assessment: no signs of nausea or vomiting Anesthetic complications: no    Last Vitals:  Filed Vitals:   10/28/15 0953 10/28/15 1603  BP: 172/97   Pulse: 69   Temp: 36.7 C 36.8 C  Resp: 18     Last Pain:  Filed Vitals:   10/28/15 1703  PainSc: Asleep                 Zeki Bedrosian,W. EDMOND

## 2015-10-28 NOTE — Transfer of Care (Signed)
Immediate Anesthesia Transfer of Care Note  Patient: Whitney King  Procedure(s) Performed: Procedure(s) with comments: LUMBAR 4-5 DECOMPRESSION  (N/A) - LUMBAR 4-5 DECOMPRESSION   Patient Location: PACU  Anesthesia Type:General  Level of Consciousness: awake, alert  and oriented  Airway & Oxygen Therapy: Patient Spontanous Breathing and Patient connected to nasal cannula oxygen  Post-op Assessment: Report given to RN and Post -op Vital signs reviewed and stable  Post vital signs: Reviewed and stable  Last Vitals:  Filed Vitals:   10/28/15 0953  BP: 172/97  Pulse: 69  Temp: 36.7 C  Resp: 18    Last Pain:  Filed Vitals:   10/28/15 1037  PainSc: 3       Patients Stated Pain Goal: 6 ([id number])  Complications: No apparent anesthesia complications

## 2015-10-28 NOTE — Anesthesia Procedure Notes (Signed)
Procedure Name: Intubation Date/Time: 10/28/2015 1:12 PM Performed by: Eligha Bridegroom Pre-anesthesia Checklist: Patient identified, Emergency Drugs available, Suction available and Patient being monitored Patient Re-evaluated:Patient Re-evaluated prior to inductionOxygen Delivery Method: Circle system utilized Preoxygenation: Pre-oxygenation with 100% oxygen Intubation Type: IV induction Ventilation: Mask ventilation without difficulty and Oral airway inserted - appropriate to patient size Laryngoscope Size: Mac and 3 Tube type: Oral Tube size: 7.0 mm Number of attempts: 1 Airway Equipment and Method: Stylet Placement Confirmation: ETT inserted through vocal cords under direct vision,  positive ETCO2 and breath sounds checked- equal and bilateral Secured at: 21 cm Tube secured with: Tape Dental Injury: Teeth and Oropharynx as per pre-operative assessment

## 2015-10-28 NOTE — Op Note (Signed)
NAME:  ELLINOR, PREVILLE            ACCOUNT NO.:  [id number]  MEDICAL RECORD NO.:  [id number]  LOCATION:  [facility]                         FACILITY:  [facility]  PHYSICIAN:  Phylliss Bob, MD      DATE OF BIRTH:  12-02-1970  DATE OF PROCEDURE:  10/28/2015 DATE OF DISCHARGE:  10/28/2015                              OPERATIVE REPORT   PREOPERATIVE DIAGNOSES: 1. Moderate left L4-5 facet cyst compressing the central spinal canal,     left lateral recess, and left L5 nerve. 2. Left-sided L5 radiculopathy.  POSTOPERATIVE DIAGNOSES: 1. Moderate left L4-5 facet cyst compressing the central spinal canal,     left lateral recess, and left L5 nerve. 2. Left-sided L5 radiculopathy.  PROCEDURE:  L4-5 decompression with bilateral partial facetectomy and removal of very large left-sided L4-5 facet cyst.  SURGEON:  Phylliss Bob, MD.  ASSISTANTPricilla Holm, PA-C.  ANESTHESIA:  General endotracheal anesthesia.  COMPLICATIONS:  None.  DISPOSITION:  Stable.  ESTIMATED BLOOD LOSS:  Minimal.  INDICATIONS FOR SURGERY:  Briefly, Whitney King is a very pleasant 45 year old female, who did present to me with severe and debilitating pain in the left leg.  Given the patient's ongoing and severe pain, I did obtain an MRI of her lumbar spine.  The MRI did reveal a very large left-sided L4-5 facet cyst, severely compressing the left L5 nerve.  Given the patient's ongoing pain and dysfunction, we did discuss proceeding with a decompressive procedure as reflected above.  The patient was fully aware of the risks and limitations of the procedure, including advancement of her degenerative disc disease and recurrence of her cyst as well as instability.  She did elect to proceed.  OPERATIVE DETAILS:  On October 28, 2015, the patient was brought to surgery and general endotracheal anesthesia was administered.  The patient was placed prone on a well-padded flat Jackson bed with a Wilson frame. Antibiotics were given.   The back was prepped and draped and a time-out procedure was performed.  A midline incision was made directly overlying the L4-5 intervertebral space.  The fascia was incised at the midline. The lamina of L4 and L5 were subperiosteally exposed.  A lateral intraoperative radiograph did confirm the appropriate operative level. The L4 spinous process was removed.  I then proceeded with a partial facetectomy, removing the inferior and medial aspect of the L4 lamina, and partially, the L4-5 facet joints bilaterally.  Of note, the patient was noted to have minimal spinal stenosis noted on both the right and left sides.  This was decompressed bilaterally.  I then continued the decompression well above and below the region of the L4-5 facet cyst.  I was able to develop a plane between the L4-5 facet cyst and the dura. While keeping this plane maintained, I was able to continue the partial facetectomy on the left side, and I was able to entirely remove the left- sided L4-5 facet cyst, thereby decompressing the left L5 nerve.  At the termination of the procedure, I was able to follow the L5 nerve across the region of the left intervertebral space and distally past the left L5 pedicle.  There was no compression noted.  The L4 nerve was  also identified and noted to be free of compression.  Minimal bleeding was then controlled using bipolar electrocautery in addition to Surgiflo.  The wound was then copiously irrigated.  A total of 1 L was used for irrigation from the beginning to the end of the procedure.  I then introduced 40 mg of Depo-Medrol about the epidural space in the region of the left L5 nerve.  The wound was then closed in layers using #1 Vicryl, followed by 2-0 Vicryl, followed by 4-0 Monocryl.  Benzoin and Steri-Strips were applied, followed by sterile dressing.  All instrument counts were correct at the termination of the procedure.  Of note, Pricilla Holm was my assistant throughout  surgery and did aid in retraction, suctioning, and closure from start to finish.     Phylliss Bob, MD     MD/MEDQ  D:  10/28/2015  T:  10/28/2015  Job:  [id number]

## 2015-10-28 NOTE — Anesthesia Preprocedure Evaluation (Addendum)
Anesthesia Evaluation  Patient identified by MRN, date of birth, ID band Patient awake    Reviewed: Allergy & Precautions, H&P , NPO status , Patient's Chart, lab work & pertinent test results  History of Anesthesia Complications (+) PONV  Airway Mallampati: II  TM Distance: >3 FB Neck ROM: Full    Dental no notable dental hx. (+) Teeth Intact, Dental Advisory Given   Pulmonary asthma , former smoker,    Pulmonary exam normal breath sounds clear to auscultation       Cardiovascular hypertension, Pt. on medications negative cardio ROS   Rhythm:Regular Rate:Normal     Neuro/Psych  Headaches, Anxiety Bipolar Disorder    GI/Hepatic Neg liver ROS, GERD  Medicated and Controlled,  Endo/Other  negative endocrine ROS  Renal/GU negative Renal ROS  negative genitourinary   Musculoskeletal   Abdominal   Peds  Hematology negative hematology ROS (+)   Anesthesia Other Findings   Reproductive/Obstetrics negative OB ROS                            Anesthesia Physical Anesthesia Plan  ASA: II  Anesthesia Plan: General   Post-op Pain Management:    Induction: Intravenous  Airway Management Planned: Oral ETT  Additional Equipment:   Intra-op Plan:   Post-operative Plan: Extubation in OR  Informed Consent: I have reviewed the patients History and Physical, chart, labs and discussed the procedure including the risks, benefits and alternatives for the proposed anesthesia with the patient or authorized representative who has indicated his/her understanding and acceptance.   Dental advisory given  Plan Discussed with: CRNA  Anesthesia Plan Comments:         Anesthesia Quick Evaluation

## 2015-10-29 ENCOUNTER — Encounter (HOSPITAL_COMMUNITY): Payer: Self-pay | Admitting: Orthopedic Surgery

## 2015-10-29 NOTE — Addendum Note (Signed)
Addendum  created 10/29/15 1441 by Eligha Bridegroom, CRNA   Modules edited: Anesthesia Events, Narrator   Narrator:  Narrator: Event Log Edited

## 2015-11-11 ENCOUNTER — Ambulatory Visit: Payer: BLUE CROSS/BLUE SHIELD | Admitting: Family

## 2015-11-11 ENCOUNTER — Ambulatory Visit: Payer: Self-pay | Admitting: Family

## 2015-11-11 DIAGNOSIS — Z0289 Encounter for other administrative examinations: Secondary | ICD-10-CM

## 2015-11-12 ENCOUNTER — Ambulatory Visit: Payer: Self-pay | Admitting: Family

## 2015-11-12 ENCOUNTER — Encounter: Payer: Self-pay | Admitting: Family

## 2015-12-22 ENCOUNTER — Telehealth: Payer: Self-pay | Admitting: Family

## 2015-12-22 NOTE — Telephone Encounter (Signed)
Please schedule pt follow up appt. for bp check also please advise pt to bring in paper work from [facility]. Thanks---PC

## 2015-12-22 NOTE — Telephone Encounter (Signed)
Left pt a message informing to schedule FU and to bring in paperwork.

## 2016-03-24 ENCOUNTER — Other Ambulatory Visit: Payer: Self-pay | Admitting: Family

## 2016-03-25 NOTE — Telephone Encounter (Signed)
Requested drug refills are authorized 30-day Only, however, the patient needs further evaluation and/or laboratory testing before further refills are given. Ask her to make an appointment for this/SLS 12/01 Please call patient and schedule F/U office visit prior to future refill authorizations/SLS 12/01

## 2016-03-28 NOTE — Telephone Encounter (Signed)
Patients VM has not been set up. Could not leave message

## 2016-04-26 ENCOUNTER — Telehealth: Payer: Self-pay | Admitting: Family

## 2016-04-26 MED ORDER — VENLAFAXINE HCL ER 75 MG PO CP24: ORAL_CAPSULE | ORAL | 0 refills | Status: DC

## 2016-04-26 NOTE — Telephone Encounter (Signed)
30 day supply sent to pharmacy, left message on voicemail re: refill completion and to call if any questions.

## 2016-04-26 NOTE — Telephone Encounter (Signed)
Caller name: Relationship to patient: Self Can be reached: [phone]  Pharmacy:  [facility] [place], [address] [phone] (Phone) [phone] (Fax)     Reason for call: Request refill venlafaxine XR (EFFEXOR-XR) 75 MG 24 hr capsule  Patient cancelled appt for tomorrow and rescheduled for 1/8

## 2016-04-27 ENCOUNTER — Ambulatory Visit: Payer: BLUE CROSS/BLUE SHIELD | Admitting: Family

## 2016-05-02 ENCOUNTER — Encounter: Payer: Self-pay | Admitting: Family

## 2016-05-02 ENCOUNTER — Ambulatory Visit (INDEPENDENT_AMBULATORY_CARE_PROVIDER_SITE_OTHER): Payer: BLUE CROSS/BLUE SHIELD | Admitting: Family

## 2016-05-02 VITALS — BP 140/102 | HR 72 | Temp 98.2°F | Resp 16 | Ht 66.0 in | Wt 165.6 lb

## 2016-05-02 DIAGNOSIS — I10 Essential (primary) hypertension: Secondary | ICD-10-CM | POA: Diagnosis not present

## 2016-05-02 DIAGNOSIS — R635 Abnormal weight gain: Secondary | ICD-10-CM

## 2016-05-02 DIAGNOSIS — F411 Generalized anxiety disorder: Secondary | ICD-10-CM

## 2016-05-02 MED ORDER — VENLAFAXINE HCL ER 150 MG PO CP24: 150.0000 mg | ORAL_CAPSULE | Freq: Every day | ORAL | 1 refills | Status: DC

## 2016-05-02 MED ORDER — HYDROCHLOROTHIAZIDE 25 MG PO TABS: 25.0000 mg | ORAL_TABLET | Freq: Every day | ORAL | 3 refills | Status: DC

## 2016-05-02 MED ORDER — CLONAZEPAM 0.5 MG PO TABS: 0.5000 mg | ORAL_TABLET | Freq: Three times a day (TID) | ORAL | 0 refills | Status: DC | PRN

## 2016-05-02 NOTE — Progress Notes (Signed)
Pre visit review using our clinic review tool, if applicable. No additional management support is needed unless otherwise documented below in the visit note. 

## 2016-05-02 NOTE — Patient Instructions (Signed)
Increase effexor to 150mg . Start hctz.

## 2016-05-02 NOTE — Assessment & Plan Note (Addendum)
Deteriorated with recent weight gain. Also has a strong family history or hypertension. Begin HCTZ.

## 2016-05-02 NOTE — Progress Notes (Signed)
Subjective:    Patient ID: Whitney King, female    DOB: 02/01/1971, 46 y.o.   MRN: [id number]  HPI   Whitney King is a 46 yr old female who presents today for follow up:   Weight gain- had back surgery (l4-5 decompression) over the summer due to a compressive ganglion.  Reports significant improvement in her pain following surgery however she was less active until recently and subsequently gained weight.  Wt Readings from Last 3 Encounters:  05/02/16 165 lb 9.6 oz (75.1 kg)  10/28/15 150 lb (68 kg)  08/11/15 150 lb (68 kg)   Depression/anxiety- denies significant depression symptoms but notes recent worsening of her anxiety. Recently got divorced. She is engaged.  Struggling with deciding to move into her fiance's house and give up her alimony. She has been using klonopin more frequently recently.   BP Readings from Last 3 Encounters:  05/02/16 140/90  10/28/15 (!) 147/98  08/11/15 135/84     Review of Systems    see HPI  Past Medical History:  Diagnosis Date  . Anxiety   . Asthma    exercise induced, allergies, no problems since allergy shots  . Bipolar disorder ([place])   . GERD (gastroesophageal reflux disease)    diet controlled only  . Headache(784.0)    otc meds prn  . Hypertension    no medications at this time- post pregnancy  . IBS (irritable bowel syndrome)   . Mental disorder   . Migraines   . PONV (postoperative nausea and vomiting)   . Seasonal allergies   . SVD (spontaneous vaginal delivery)    x 1     Social History   Social History  . Marital status: Legally Separated    Spouse name: N/A  . Number of children: 2  . Years of education: N/A   Occupational History  . 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     [employer]   Social History Main Topics  . Smoking status: Former Smoker    Packs/day: 0.05    Years: 20.00    Types: Cigarettes    Quit date: 02/10/2015  . Smokeless tobacco: Never Used  . Alcohol use 1.2 oz/week    2 Glasses of wine per week   Comment: socially  . Drug use: No  . Sexual activity: Yes    Birth control/ protection: None   Other Topics Concern  . Not on file   Social History Narrative   Separated   2 children   Works as an Engineer, structural PA   Enjoys gym, kayak, paddle, boarding, outside activities    Past Surgical History:  Procedure Laterality Date  . COLONOSCOPY W/ POLYPECTOMY    . [place] N/A 10/17/2012   Procedure: DILATATION & CURETTAGE/HYSTEROSCOPY WITH NOVASURE ABLATION;  Surgeon: Daria Pastures, MD;  Location: [facility];  Service: Gynecology;  Laterality: N/A;  . EYE SURGERY     [facility] - bilateral eyes  . LAPAROSCOPIC TUBAL LIGATION Bilateral 07/15/2015   Procedure: LAPAROSCOPIC TUBAL LIGATION;  Surgeon: Bobbye Charleston, MD;  Location: [facility];  Service: Gynecology;  Laterality: Bilateral;  FILSHIE CLIPS  . left leg vein surgery Bilateral    Laser  . left wrist surgery  2012   fracture repair  . LUMBAR LAMINECTOMY/DECOMPRESSION MICRODISCECTOMY N/A 10/28/2015   Procedure: LUMBAR 4-5 DECOMPRESSION ;  Surgeon: Phylliss Bob, MD;  Location: [facility];  Service: Orthopedics;  Laterality: N/A;  LUMBAR 4-5 DECOMPRESSION   . RECTOCELE REPAIR N/A 10/17/2012  Procedure: POSTERIOR REPAIR (RECTOCELE);  Surgeon: Daria Pastures, MD;  Location: [facility];  Service: Gynecology;  Laterality: N/A;  . RIGHT ANKLE SURGERY  1996   RECONSTRUCTION  . WISDOM TOOTH EXTRACTION      Family History  Problem Relation Age of Onset  . Colon cancer Mother   . Hyperlipidemia Mother   . Hypertension Mother   . Colon cancer Father   . Heart disease Father   . Hypertension Father   . Stroke Paternal Grandfather   . Hypertension Maternal Grandmother     Allergies  Allergen Reactions  . Erythromycin Diarrhea    Current Outpatient Prescriptions on File Prior to Visit  Medication Sig Dispense Refill  . cetirizine (ZYRTEC) 10 MG tablet Take 10 mg by mouth daily as needed  for allergies.    . diphenhydrAMINE (BENADRYL) 50 MG tablet Take 50 mg by mouth at bedtime as needed for sleep.     Marland Kitchen doxycycline (PERIOSTAT) 20 MG tablet Take 1 tablet (20 mg total) by mouth 2 (two) times daily. 90 tablet 1  . rizatriptan (MAXALT) 10 MG tablet Take 1 tablet (10 mg total) by mouth as needed for migraine. May repeat in 2 hours if needed 10 tablet 5   No current facility-administered medications on file prior to visit.     BP 140/90 (BP Location: Right Arm, Cuff Size: Normal)   Pulse 72   Temp 98.2 F (36.8 C) (Oral)   Resp 16   Ht 5\' 6"  (1.676 m)   Wt 165 lb 9.6 oz (75.1 kg)   SpO2 100% Comment: room air  BMI 26.73 kg/m    Objective:   Physical Exam  Constitutional: She is oriented to person, place, and time. She appears well-developed and well-nourished.  HENT:  Head: Normocephalic and atraumatic.  Neurological: She is alert and oriented to person, place, and time.  Psychiatric: She has a normal mood and affect. Her behavior is normal. Judgment and thought content normal.          Assessment & Plan:  Anxiety- Deteriorated.  Will increase effexor from 75mg  to 150mg .  Continue klonopin prn. Advised pt to arrange follow up with her therapist Whitney King).    Weight gain- likely related to recent decrease in activity. Will obtain TSH.  Discussed diet/exercise and weight loss.

## 2016-05-03 ENCOUNTER — Other Ambulatory Visit (INDEPENDENT_AMBULATORY_CARE_PROVIDER_SITE_OTHER): Payer: BLUE CROSS/BLUE SHIELD

## 2016-05-03 DIAGNOSIS — E039 Hypothyroidism, unspecified: Secondary | ICD-10-CM

## 2016-05-03 DIAGNOSIS — I43 Cardiomyopathy in diseases classified elsewhere: Secondary | ICD-10-CM

## 2016-05-03 DIAGNOSIS — I519 Heart disease, unspecified: Principal | ICD-10-CM

## 2016-05-03 LAB — TSH: TSH: 6.17 u[IU]/mL — AB (ref 0.35–4.50)

## 2016-05-03 LAB — T4, FREE: Free T4: 0.67 ng/dL (ref 0.60–1.60)

## 2016-05-03 LAB — T3, FREE: T3, Free: 2.8 pg/mL (ref 2.3–4.2)

## 2016-05-04 ENCOUNTER — Ambulatory Visit (INDEPENDENT_AMBULATORY_CARE_PROVIDER_SITE_OTHER): Payer: BLUE CROSS/BLUE SHIELD | Admitting: Psychology

## 2016-05-04 ENCOUNTER — Telehealth: Payer: Self-pay | Admitting: Family

## 2016-05-04 DIAGNOSIS — E039 Hypothyroidism, unspecified: Secondary | ICD-10-CM

## 2016-05-04 DIAGNOSIS — F4321 Adjustment disorder with depressed mood: Secondary | ICD-10-CM | POA: Diagnosis not present

## 2016-05-04 MED ORDER — LEVOTHYROXINE SODIUM 50 MCG PO TABS: 50.0000 ug | ORAL_TABLET | Freq: Every day | ORAL | 1 refills | Status: DC

## 2016-05-04 NOTE — Telephone Encounter (Signed)
Lab work shows subclinical hypothryroid. Since she has had weight gain, I would like her to start synthroid 34mcg,  Repeat tsh in 6 weeks.

## 2016-05-04 NOTE — Telephone Encounter (Signed)
Notified pt and she voices understanding. Lab appt scheduled for 06/15/16 at 7:15am and future order entered.

## 2016-05-18 ENCOUNTER — Encounter: Payer: Self-pay | Admitting: Family

## 2016-05-18 ENCOUNTER — Ambulatory Visit (INDEPENDENT_AMBULATORY_CARE_PROVIDER_SITE_OTHER): Payer: BLUE CROSS/BLUE SHIELD | Admitting: Family

## 2016-05-18 VITALS — BP 156/91 | HR 72 | Temp 98.3°F | Resp 16 | Ht 66.0 in | Wt 162.4 lb

## 2016-05-18 DIAGNOSIS — Z8639 Personal history of other endocrine, nutritional and metabolic disease: Secondary | ICD-10-CM | POA: Insufficient documentation

## 2016-05-18 DIAGNOSIS — E039 Hypothyroidism, unspecified: Secondary | ICD-10-CM | POA: Diagnosis not present

## 2016-05-18 DIAGNOSIS — I10 Essential (primary) hypertension: Secondary | ICD-10-CM

## 2016-05-18 HISTORY — DX: Personal history of other endocrine, nutritional and metabolic disease: Z86.39

## 2016-05-18 HISTORY — DX: Hypothyroidism, unspecified: E03.9

## 2016-05-18 LAB — BASIC METABOLIC PANEL
BUN: 10 mg/dL (ref 6–23)
CHLORIDE: 104 meq/L (ref 96–112)
CO2: 31 mEq/L (ref 19–32)
CREATININE: 0.81 mg/dL (ref 0.40–1.20)
Calcium: 9.4 mg/dL (ref 8.4–10.5)
GFR: 80.84 mL/min (ref >60.00)
GLUCOSE: 87 mg/dL (ref 70–99)
Potassium: 3.9 mEq/L (ref 3.5–5.1)
Sodium: 140 mEq/L (ref 135–145)

## 2016-05-18 MED ORDER — AMLODIPINE BESYLATE 5 MG PO TABS: 5.0000 mg | ORAL_TABLET | Freq: Every day | ORAL | 3 refills | Status: DC

## 2016-05-18 MED ORDER — HYDROCHLOROTHIAZIDE 25 MG PO TABS: 25.0000 mg | ORAL_TABLET | Freq: Every day | ORAL | 3 refills | Status: DC

## 2016-05-18 MED ORDER — DOXYCYCLINE HYCLATE 20 MG PO TABS: 20.0000 mg | ORAL_TABLET | Freq: Two times a day (BID) | ORAL | 1 refills | Status: DC

## 2016-05-18 MED ORDER — RIZATRIPTAN BENZOATE 10 MG PO TABS: 10.0000 mg | ORAL_TABLET | ORAL | 5 refills | Status: DC | PRN

## 2016-05-18 NOTE — Patient Instructions (Signed)
Please add amlodipine 5mg  once daily for your blood pressure. Complete lab work prior to leaving.

## 2016-05-18 NOTE — Assessment & Plan Note (Signed)
Clinically stable. Continue synthroid, obtain follow up TSH in 1 month.

## 2016-05-18 NOTE — Assessment & Plan Note (Signed)
Improved on increased dose of effexor.

## 2016-05-18 NOTE — Assessment & Plan Note (Signed)
Uncontrolled.  Will add amlodipine 5mg  once daily

## 2016-05-18 NOTE — Progress Notes (Signed)
Subjective:    Patient ID: Whitney King, female    DOB: 12-08-1970, 46 y.o.   MRN: [id number]  HPI  Whitney King is a 46 yr old female who presents today for follow up.  Anxiety- last visit we increased her effexor from 57m to 1511m Reports improvement in her anxiety symptoms.  She does report dry mouth. She met recently with her therapist.   Hypothyroid- The patient had noted weight gain last visit.  TSH revealed subclinical hypothyroidism. She was started on synthroid. She has lost 3 pounds since her last visit.  Wt Readings from Last 3 Encounters:  05/18/16 162 lb 6.4 oz (73.7 kg)  05/02/16 165 lb 9.6 oz (75.1 kg)  10/28/15 150 lb (68 kg)    Lab Results  Component Value Date   TSH 6.17 (H) 05/02/2016   HTN-  She was started on hctz last visit. Took last dose of hctz last night.   BP Readings from Last 3 Encounters:  05/18/16 (!) 156/91  05/02/16 (!) 140/102  10/28/15 (!) 147/98     Review of Systems See HPI  Past Medical History:  Diagnosis Date  . Anxiety   . Asthma    exercise induced, allergies, no problems since allergy shots  . [place]  . GERD (gastroesophageal reflux disease)    diet controlled only  . Headache(784.0)    otc meds prn  . Hypertension    no medications at this time- post pregnancy  . IBS (irritable bowel syndrome)   . Mental disorder   . Migraines   . PONV (postoperative nausea and vomiting)   . Seasonal allergies   . SVD (spontaneous vaginal delivery)    x 1     Social History   Social History  . Marital status: Legally Separated    Spouse name: N/A  . Number of children: 2  . Years of education: N/A   Occupational History  . 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     [employer]   Social History Main Topics  . Smoking status: Former Smoker    Packs/day: 0.05    Years: 20.00    Types: Cigarettes    Quit date: 02/10/2015  . Smokeless tobacco: Never Used  . Alcohol use 1.2 oz/week    2 Glasses of wine per week   Comment: socially  . Drug use: No  . Sexual activity: Yes    Birth control/ protection: None   Other Topics Concern  . Not on file   Social History Narrative   Separated   2 children   Works as an atEngineer, structuralA   Enjoys gym, kayak, paddle, boarding, outside activities    Past Surgical History:  Procedure Laterality Date  . COLONOSCOPY W/ POLYPECTOMY    [place]   Procedure: DILATATION & CURETTAGE/HYSTEROSCOPY WITH NOVASURE ABLATION;  Surgeon: MiDaria PasturesMD;  [facility];  Service: Gynecology;  Laterality: N/A;  . EYE SURGERY     PRK - bilateral eyes  . LAPAROSCOPIC TUBAL LIGATION Bilateral 07/15/2015   Procedure: LAPAROSCOPIC TUBAL LIGATION;  Surgeon: MiBobbye CharlestonMD;  [facility];  Service: Gynecology;  Laterality: Bilateral;  FILSHIE CLIPS  . left leg vein surgery Bilateral    Laser  . left wrist surgery  2012   fracture repair  . LUMBAR LAMINECTOMY/DECOMPRESSION MICRODISCECTOMY N/A 10/28/2015   Procedure: LUMBAR 4-5 DECOMPRESSION ;  Surgeon: MaPhylliss BobMD;  [facility] Service: Orthopedics;  Laterality:  N/A;  LUMBAR 4-5 DECOMPRESSION   . RECTOCELE REPAIR N/A 10/17/2012   Procedure: POSTERIOR REPAIR (RECTOCELE);  Surgeon: Daria Pastures, MD;  Location: [facility];  Service: Gynecology;  Laterality: N/A;  . RIGHT ANKLE SURGERY  1996   RECONSTRUCTION  . WISDOM TOOTH EXTRACTION      Family History  Problem Relation Age of Onset  . Colon cancer Mother   . Hyperlipidemia Mother   . Hypertension Mother   . Colon cancer Father   . Heart disease Father   . Hypertension Father   . Stroke Paternal Grandfather   . Hypertension Maternal Grandmother     Allergies  Allergen Reactions  . Erythromycin Diarrhea    Current Outpatient Prescriptions on File Prior to Visit  Medication Sig Dispense Refill  . cetirizine (ZYRTEC) 10 MG tablet Take 10 mg by mouth daily as needed  for allergies.    . clonazePAM (KLONOPIN) 0.5 MG tablet Take 1 tablet (0.5 mg total) by mouth 3 (three) times daily as needed for anxiety. 30 tablet 0  . diphenhydrAMINE (BENADRYL) 50 MG tablet Take 50 mg by mouth at bedtime as needed for sleep.     Marland Kitchen doxycycline (PERIOSTAT) 20 MG tablet Take 1 tablet (20 mg total) by mouth 2 (two) times daily. 90 tablet 1  . hydrochlorothiazide (HYDRODIURIL) 25 MG tablet Take 1 tablet (25 mg total) by mouth daily. 30 tablet 3  . levothyroxine (SYNTHROID, LEVOTHROID) 50 MCG tablet Take 1 tablet (50 mcg total) by mouth daily. 30 tablet 1  . rizatriptan (MAXALT) 10 MG tablet Take 1 tablet (10 mg total) by mouth as needed for migraine. May repeat in 2 hours if needed 10 tablet 5  . venlafaxine XR (EFFEXOR XR) 150 MG 24 hr capsule Take 1 capsule (150 mg total) by mouth daily with breakfast. 30 capsule 1   No current facility-administered medications on file prior to visit.     Ht '5\' 6"'  (1.676 m)       Objective:   Physical Exam  Constitutional: She is oriented to person, place, and time. She appears well-developed and well-nourished.  HENT:  Head: Normocephalic and atraumatic.  Neurological: She is alert and oriented to person, place, and time.  Skin: Skin is warm and dry.  Psychiatric: She has a normal mood and affect. Her behavior is normal. Judgment and thought content normal.          Assessment & Plan:

## 2016-05-18 NOTE — Progress Notes (Signed)
Pre visit review using our clinic review tool, if applicable. No additional management support is needed unless otherwise documented below in the visit note. 

## 2016-05-24 ENCOUNTER — Telehealth: Payer: Self-pay | Admitting: *Deleted

## 2016-05-24 MED ORDER — FLUCONAZOLE 150 MG PO TABS: ORAL_TABLET | ORAL | 0 refills | Status: DC

## 2016-05-24 NOTE — Telephone Encounter (Signed)
Left message on voicemail re: completion and to call if any questions.

## 2016-05-24 NOTE — Telephone Encounter (Signed)
rx sent

## 2016-05-24 NOTE — Telephone Encounter (Signed)
Pt called stating she started doxycycline for her face and not has intense vaginal itching and discharge. Pt is requesting diflucan. States we can leave message on her voicemail. Please advise?

## 2016-06-10 ENCOUNTER — Ambulatory Visit (INDEPENDENT_AMBULATORY_CARE_PROVIDER_SITE_OTHER): Payer: BLUE CROSS/BLUE SHIELD | Admitting: Psychology

## 2016-06-10 DIAGNOSIS — F4321 Adjustment disorder with depressed mood: Secondary | ICD-10-CM

## 2016-06-15 ENCOUNTER — Encounter: Payer: Self-pay | Admitting: Family

## 2016-06-15 ENCOUNTER — Other Ambulatory Visit (INDEPENDENT_AMBULATORY_CARE_PROVIDER_SITE_OTHER): Payer: BLUE CROSS/BLUE SHIELD

## 2016-06-15 ENCOUNTER — Ambulatory Visit (INDEPENDENT_AMBULATORY_CARE_PROVIDER_SITE_OTHER): Payer: BLUE CROSS/BLUE SHIELD | Admitting: Family

## 2016-06-15 VITALS — BP 135/94 | HR 82 | Temp 98.3°F | Resp 16 | Ht 66.0 in | Wt 165.2 lb

## 2016-06-15 DIAGNOSIS — F32A Depression, unspecified: Secondary | ICD-10-CM

## 2016-06-15 DIAGNOSIS — I10 Essential (primary) hypertension: Secondary | ICD-10-CM | POA: Diagnosis not present

## 2016-06-15 DIAGNOSIS — E039 Hypothyroidism, unspecified: Secondary | ICD-10-CM | POA: Diagnosis not present

## 2016-06-15 DIAGNOSIS — F419 Anxiety disorder, unspecified: Secondary | ICD-10-CM

## 2016-06-15 DIAGNOSIS — F418 Other specified anxiety disorders: Secondary | ICD-10-CM

## 2016-06-15 DIAGNOSIS — F329 Major depressive disorder, single episode, unspecified: Secondary | ICD-10-CM

## 2016-06-15 LAB — TSH: TSH: 2.63 u[IU]/mL (ref 0.35–4.50)

## 2016-06-15 MED ORDER — VENLAFAXINE HCL ER 150 MG PO CP24: 150.0000 mg | ORAL_CAPSULE | Freq: Every day | ORAL | 1 refills | Status: DC

## 2016-06-15 NOTE — Patient Instructions (Signed)
Please check your blood pressure once daily for 1 week and send me your readings via mychart. Complete lab work prior to leaving.  

## 2016-06-15 NOTE — Progress Notes (Signed)
Pre visit review using our clinic review tool, if applicable. No additional management support is needed unless otherwise documented below in the visit note. 

## 2016-06-15 NOTE — Assessment & Plan Note (Signed)
Follow up bp 130/90.  I have asked the patient to check daily at home for 1 week and send me her readings via mychart.  Continue amlodipine.

## 2016-06-15 NOTE — Assessment & Plan Note (Signed)
Will obtain follow up tsh.

## 2016-06-15 NOTE — Assessment & Plan Note (Signed)
Stable on effexor. Continue same.  

## 2016-06-15 NOTE — Progress Notes (Signed)
Subjective:    Patient ID: Whitney King, female    DOB: 1971/03/25, 46 y.o.   MRN: [id number]  HPI  Whitney King is a 46 yr old female who presents today for follow up.  1) HTN- last visit we added amlodipine 5mg  once daily.  Reports tolerating without difficulty BP Readings from Last 3 Encounters:  06/15/16 (!) 135/94  05/18/16 (!) 156/91  05/02/16 (!) 140/102   2) Hypothyroidism- reports that she felt "amazing" initially after starting synthroid.  But now feels tired again.  Lab Results  Component Value Date   TSH 6.17 (H) 05/02/2016    3) Anxiety- maintained on effexor. Reports that her anxiety is well controlled on current dose of effexor.    Review of Systems See HPI  Past Medical History:  Diagnosis Date  . Anxiety   . Asthma    exercise induced, allergies, no problems since allergy shots  . Bipolar disorder ([place])   . GERD (gastroesophageal reflux disease)    diet controlled only  . Headache(784.0)    otc meds prn  . Hypertension    no medications at this time- post pregnancy  . IBS (irritable bowel syndrome)   . Mental disorder   . Migraines   . PONV (postoperative nausea and vomiting)   . Seasonal allergies   . SVD (spontaneous vaginal delivery)    x 1     Social History   Social History  . Marital status: Legally Separated    Spouse name: N/A  . Number of children: 2  . Years of education: N/A   Occupational History  . 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     [employer]   Social History Main Topics  . Smoking status: Former Smoker    Packs/day: 0.05    Years: 20.00    Types: Cigarettes    Quit date: 02/10/2015  . Smokeless tobacco: Never Used  . Alcohol use 1.2 oz/week    2 Glasses of wine per week     Comment: socially  . Drug use: No  . Sexual activity: Yes    Birth control/ protection: None   Other Topics Concern  . Not on file   Social History Narrative   Separated   2 children   Works as an Engineer, structural PA   Enjoys gym, kayak,  paddle, boarding, outside activities    Past Surgical History:  Procedure Laterality Date  . COLONOSCOPY W/ POLYPECTOMY    . [place] N/A 10/17/2012   Procedure: DILATATION & CURETTAGE/HYSTEROSCOPY WITH NOVASURE ABLATION;  Surgeon: Daria Pastures, MD;  Location: [facility];  Service: Gynecology;  Laterality: N/A;  . EYE SURGERY     PRK - bilateral eyes  . LAPAROSCOPIC TUBAL LIGATION Bilateral 07/15/2015   Procedure: LAPAROSCOPIC TUBAL LIGATION;  Surgeon: Bobbye Charleston, MD;  Location: [facility];  Service: Gynecology;  Laterality: Bilateral;  FILSHIE CLIPS  . left leg vein surgery Bilateral    Laser  . left wrist surgery  2012   fracture repair  . LUMBAR LAMINECTOMY/DECOMPRESSION MICRODISCECTOMY N/A 10/28/2015   Procedure: LUMBAR 4-5 DECOMPRESSION ;  Surgeon: Phylliss Bob, MD;  Location: [facility];  Service: Orthopedics;  Laterality: N/A;  LUMBAR 4-5 DECOMPRESSION   . RECTOCELE REPAIR N/A 10/17/2012   Procedure: POSTERIOR REPAIR (RECTOCELE);  Surgeon: Daria Pastures, MD;  Location: [facility];  Service: Gynecology;  Laterality: N/A;  . RIGHT ANKLE SURGERY  1996   RECONSTRUCTION  . WISDOM TOOTH EXTRACTION  Family History  Problem Relation Age of Onset  . Colon cancer Mother   . Hyperlipidemia Mother   . Hypertension Mother   . Colon cancer Father   . Heart disease Father   . Hypertension Father   . Stroke Paternal Grandfather   . Hypertension Maternal Grandmother     Allergies  Allergen Reactions  . Erythromycin Diarrhea    Current Outpatient Prescriptions on File Prior to Visit  Medication Sig Dispense Refill  . amLODipine (NORVASC) 5 MG tablet Take 1 tablet (5 mg total) by mouth daily. 30 tablet 3  . cetirizine (ZYRTEC) 10 MG tablet Take 10 mg by mouth daily as needed for allergies.    . clonazePAM (KLONOPIN) 0.5 MG tablet Take 1 tablet (0.5 mg total) by mouth 3 (three) times daily as needed for anxiety. 30 tablet 0  .  doxycycline (PERIOSTAT) 20 MG tablet Take 1 tablet (20 mg total) by mouth 2 (two) times daily. 90 tablet 1  . hydrochlorothiazide (HYDRODIURIL) 25 MG tablet Take 1 tablet (25 mg total) by mouth daily. 30 tablet 3  . levothyroxine (SYNTHROID, LEVOTHROID) 50 MCG tablet Take 1 tablet (50 mcg total) by mouth daily. 30 tablet 1  . rizatriptan (MAXALT) 10 MG tablet Take 1 tablet (10 mg total) by mouth as needed for migraine. May repeat in 2 hours if needed 10 tablet 5  . venlafaxine XR (EFFEXOR XR) 150 MG 24 hr capsule Take 1 capsule (150 mg total) by mouth daily with breakfast. 30 capsule 1   No current facility-administered medications on file prior to visit.     BP (!) 135/94 (BP Location: Right Arm, Cuff Size: Normal)   Pulse 82   Temp 98.3 F (36.8 C) (Oral)   Resp 16   Ht 5\' 6"  (1.676 m)   Wt 165 lb 3.2 oz (74.9 kg)   SpO2 100% Comment: room air  BMI 26.66 kg/m       Objective:   Physical Exam  Constitutional: She is oriented to person, place, and time. She appears well-developed and well-nourished.  HENT:  Head: Normocephalic and atraumatic.  Cardiovascular: Normal rate, regular rhythm and normal heart sounds.   No murmur heard. Pulmonary/Chest: Effort normal and breath sounds normal. No respiratory distress. She has no wheezes.  Musculoskeletal: She exhibits no edema.  Neurological: She is alert and oriented to person, place, and time.  Psychiatric: She has a normal mood and affect. Her behavior is normal. Judgment and thought content normal.          Assessment & Plan:

## 2016-06-20 ENCOUNTER — Telehealth: Payer: Self-pay | Admitting: Family

## 2016-06-20 MED ORDER — LEVOTHYROXINE SODIUM 50 MCG PO TABS: 50.0000 ug | ORAL_TABLET | Freq: Every day | ORAL | 2 refills | Status: DC

## 2016-06-20 NOTE — Telephone Encounter (Signed)
Spoke with pt. States she is unable to see most recent TSH via mychart. Released result and advised pt I would send additional refills to the pharmacy for her levothyroxine.

## 2016-06-20 NOTE — Telephone Encounter (Signed)
Relation to [id number] Call back number:[phone]   Reason for call:  Patient reviewed lab results on mychart and has additional questions, please advise

## 2016-07-15 ENCOUNTER — Ambulatory Visit (INDEPENDENT_AMBULATORY_CARE_PROVIDER_SITE_OTHER): Payer: BLUE CROSS/BLUE SHIELD | Admitting: Psychology

## 2016-07-15 DIAGNOSIS — F4321 Adjustment disorder with depressed mood: Secondary | ICD-10-CM | POA: Diagnosis not present

## 2016-08-05 ENCOUNTER — Ambulatory Visit (INDEPENDENT_AMBULATORY_CARE_PROVIDER_SITE_OTHER): Payer: BLUE CROSS/BLUE SHIELD | Admitting: Psychology

## 2016-08-05 DIAGNOSIS — F4321 Adjustment disorder with depressed mood: Secondary | ICD-10-CM | POA: Diagnosis not present

## 2016-08-29 ENCOUNTER — Other Ambulatory Visit: Payer: Self-pay | Admitting: Family

## 2016-09-07 ENCOUNTER — Other Ambulatory Visit: Payer: Self-pay | Admitting: Family

## 2016-09-16 ENCOUNTER — Ambulatory Visit: Payer: BLUE CROSS/BLUE SHIELD | Admitting: Psychology

## 2016-09-21 ENCOUNTER — Other Ambulatory Visit: Payer: Self-pay | Admitting: Family

## 2016-10-05 ENCOUNTER — Other Ambulatory Visit: Payer: Self-pay | Admitting: Family

## 2016-10-05 NOTE — Telephone Encounter (Signed)
Pt is due for follow up please call and schedule.  

## 2016-10-05 NOTE — Telephone Encounter (Signed)
lvm advising patient of message below °

## 2016-10-14 ENCOUNTER — Ambulatory Visit: Payer: BLUE CROSS/BLUE SHIELD | Admitting: Psychology

## 2016-10-23 LAB — HM PAP SMEAR: HM Pap smear: ABNORMAL

## 2016-10-23 LAB — HM MAMMOGRAPHY: HM MAMMO: NORMAL (ref 0–4)

## 2016-10-28 ENCOUNTER — Other Ambulatory Visit: Payer: Self-pay | Admitting: Family

## 2016-10-28 NOTE — Telephone Encounter (Signed)
Patient past due for 3 month follow up.  Please call to schedule patient

## 2016-10-31 NOTE — Telephone Encounter (Signed)
lvm for pt advising her to call back to schedule FU

## 2016-11-09 ENCOUNTER — Other Ambulatory Visit: Payer: Self-pay | Admitting: Family

## 2016-11-10 ENCOUNTER — Other Ambulatory Visit: Payer: Self-pay | Admitting: Obstetrics and Gynecology

## 2016-11-10 DIAGNOSIS — R928 Other abnormal and inconclusive findings on diagnostic imaging of breast: Secondary | ICD-10-CM

## 2016-11-14 ENCOUNTER — Ambulatory Visit
Admission: RE | Admit: 2016-11-14 | Discharge: 2016-11-14 | Disposition: A | Discharge status: Home / Self Care | Payer: BLUE CROSS/BLUE SHIELD | Source: Ambulatory Visit | Attending: Obstetrics and Gynecology | Admitting: Obstetrics and Gynecology

## 2016-11-14 DIAGNOSIS — R928 Other abnormal and inconclusive findings on diagnostic imaging of breast: Secondary | ICD-10-CM

## 2016-11-16 ENCOUNTER — Ambulatory Visit (INDEPENDENT_AMBULATORY_CARE_PROVIDER_SITE_OTHER): Payer: BLUE CROSS/BLUE SHIELD | Admitting: Family

## 2016-11-16 ENCOUNTER — Encounter: Payer: Self-pay | Admitting: Family

## 2016-11-16 VITALS — BP 126/89 | HR 79 | Temp 98.5°F | Resp 16 | Ht 66.0 in | Wt 164.8 lb

## 2016-11-16 DIAGNOSIS — E039 Hypothyroidism, unspecified: Secondary | ICD-10-CM | POA: Diagnosis not present

## 2016-11-16 DIAGNOSIS — G43909 Migraine, unspecified, not intractable, without status migrainosus: Secondary | ICD-10-CM

## 2016-11-16 DIAGNOSIS — F419 Anxiety disorder, unspecified: Secondary | ICD-10-CM | POA: Diagnosis not present

## 2016-11-16 DIAGNOSIS — I10 Essential (primary) hypertension: Secondary | ICD-10-CM

## 2016-11-16 LAB — BASIC METABOLIC PANEL
BUN: 13 mg/dL (ref 6–23)
CALCIUM: 9.6 mg/dL (ref 8.4–10.5)
CHLORIDE: 103 meq/L (ref 96–112)
CO2: 29 meq/L (ref 19–32)
Creatinine, Ser: 0.88 mg/dL (ref 0.40–1.20)
GFR: 73.3 mL/min (ref >60.00)
Glucose, Bld: 95 mg/dL (ref 70–99)
Potassium: 4 mEq/L (ref 3.5–5.1)
SODIUM: 137 meq/L (ref 135–145)

## 2016-11-16 LAB — TSH: TSH: 1.81 u[IU]/mL (ref 0.35–4.50)

## 2016-11-16 MED ORDER — LEVOTHYROXINE SODIUM 50 MCG PO TABS: ORAL_TABLET | ORAL | 1 refills | Status: DC

## 2016-11-16 MED ORDER — AMLODIPINE BESYLATE 5 MG PO TABS: ORAL_TABLET | ORAL | 1 refills | Status: DC

## 2016-11-16 MED ORDER — HYDROCHLOROTHIAZIDE 25 MG PO TABS: ORAL_TABLET | ORAL | 1 refills | Status: DC

## 2016-11-16 MED ORDER — VENLAFAXINE HCL ER 150 MG PO CP24: ORAL_CAPSULE | ORAL | 1 refills | Status: DC

## 2016-11-16 NOTE — Patient Instructions (Signed)
Please complete lab work prior to leaving.   

## 2016-11-16 NOTE — Progress Notes (Signed)
Subjective:    Patient ID: Whitney King, female    DOB: 1970/09/10, 46 y.o.   MRN: [id number]  HPI  Whitney King is a 46 yr old female who presents today for follow up.   HTN- maintained on amlodipine 5mg . Denies swelling.   BP Readings from Last 3 Encounters:  11/16/16 126/89  06/15/16 (!) 135/94  05/18/16 (!) 156/91   Anxiety- maintained on effexor.  Feels like anxiety is improved.  She is now living with her fianc and is working to picks up their home. She states that things are much better than they were previously.  Hypothyroid- current dose of synthoid is 46mcg. she reports that she does often feel tired.  Lab Results  Component Value Date   TSH 2.63 06/15/2016   Migraine- last migraine 2 days ago. Improved by maxalt. She reports that her migraines are not typically related to her menstrual cycles.  Review of Systems  See HPI  Past Medical History:  Diagnosis Date  . Anxiety   . Asthma    exercise induced, allergies, no problems since allergy shots  . Bipolar disorder ([place])   . GERD (gastroesophageal reflux disease)    diet controlled only  . Headache(784.0)    otc meds prn  . Hypertension    no medications at this time- post pregnancy  . IBS (irritable bowel syndrome)   . Mental disorder   . Migraines   . PONV (postoperative nausea and vomiting)   . Seasonal allergies   . SVD (spontaneous vaginal delivery)    x 1     Social History   Social History  . Marital status: Legally Separated    Spouse name: N/A  . Number of children: 2  . Years of education: N/A   Occupational History  . 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     [employer]   Social History Main Topics  . Smoking status: Former Smoker    Packs/day: 0.05    Years: 20.00    Types: Cigarettes    Quit date: 02/10/2015  . Smokeless tobacco: Never Used  . Alcohol use 1.2 oz/week    2 Glasses of wine per week     Comment: socially  . Drug use: No  . Sexual activity: Yes    Birth control/ protection:  None   Other Topics Concern  . Not on file   Social History Narrative   Separated   2 children   Works as an Engineer, structural PA   Enjoys gym, kayak, paddle, boarding, outside activities    Past Surgical History:  Procedure Laterality Date  . COLONOSCOPY W/ POLYPECTOMY    . [place] N/A 10/17/2012   Procedure: DILATATION & CURETTAGE/HYSTEROSCOPY WITH NOVASURE ABLATION;  Surgeon: Daria Pastures, MD;  Location: [facility];  Service: Gynecology;  Laterality: N/A;  . EYE SURGERY     PRK - bilateral eyes  . LAPAROSCOPIC TUBAL LIGATION Bilateral 07/15/2015   Procedure: LAPAROSCOPIC TUBAL LIGATION;  Surgeon: Bobbye Charleston, MD;  Location: [facility];  Service: Gynecology;  Laterality: Bilateral;  FILSHIE CLIPS  . left leg vein surgery Bilateral    Laser  . left wrist surgery  2012   fracture repair  . LUMBAR LAMINECTOMY/DECOMPRESSION MICRODISCECTOMY N/A 10/28/2015   Procedure: LUMBAR 4-5 DECOMPRESSION ;  Surgeon: Phylliss Bob, MD;  Location: [facility];  Service: Orthopedics;  Laterality: N/A;  LUMBAR 4-5 DECOMPRESSION   . RECTOCELE REPAIR N/A 10/17/2012   Procedure: POSTERIOR REPAIR (RECTOCELE);  Surgeon: Doyne Keel  Philis Pique, MD;  Location: [facility];  Service: Gynecology;  Laterality: N/A;  . RIGHT ANKLE SURGERY  1996   RECONSTRUCTION  . WISDOM TOOTH EXTRACTION      Family History  Problem Relation Age of Onset  . Colon cancer Mother   . Hyperlipidemia Mother   . Hypertension Mother   . Colon cancer Father   . Heart disease Father   . Hypertension Father   . Stroke Paternal Grandfather   . Hypertension Maternal Grandmother     Allergies  Allergen Reactions  . Erythromycin Diarrhea    Current Outpatient Prescriptions on File Prior to Visit  Medication Sig Dispense Refill  . amLODipine (NORVASC) 5 MG tablet TAKE 1 TABLET(5 MG) BY MOUTH DAILY 30 tablet 0  . cetirizine (ZYRTEC) 10 MG tablet Take 10 mg by mouth daily as needed for  allergies.    . clonazePAM (KLONOPIN) 0.5 MG tablet Take 1 tablet (0.5 mg total) by mouth 3 (three) times daily as needed for anxiety. 30 tablet 0  . doxycycline (PERIOSTAT) 20 MG tablet Take 1 tablet (20 mg total) by mouth 2 (two) times daily. 90 tablet 1  . hydrochlorothiazide (HYDRODIURIL) 25 MG tablet TAKE 1 TABLET(25 MG) BY MOUTH DAILY 30 tablet 0  . levothyroxine (SYNTHROID, LEVOTHROID) 50 MCG tablet TAKE 1 TABLET(50 MCG) BY MOUTH DAILY 30 tablet 0  . rizatriptan (MAXALT) 10 MG tablet Take 1 tablet (10 mg total) by mouth as needed for migraine. May repeat in 2 hours if needed 10 tablet 5  . venlafaxine XR (EFFEXOR-XR) 150 MG 24 hr capsule TAKE 1 CAPSULE BY MOUTH EVERY DAY WITH BREAKFAST 30 capsule 0   No current facility-administered medications on file prior to visit.     BP 126/89   Pulse 79   Temp 98.5 F (36.9 C) (Oral)   Resp 16   Ht 5\' 6"  (1.676 m)   Wt 164 lb 12.8 oz (74.8 kg)   SpO2 100%   BMI 26.60 kg/m        Objective:   Physical Exam  Constitutional: She is oriented to person, place, and time. She appears well-developed and well-nourished.  Cardiovascular: Normal rate, regular rhythm and normal heart sounds.   No murmur heard. Pulmonary/Chest: Effort normal and breath sounds normal. No respiratory distress. She has no wheezes.  Musculoskeletal: She exhibits no edema.  Neurological: She is alert and oriented to person, place, and time.  Psychiatric: She has a normal mood and affect. Her behavior is normal. Judgment and thought content normal.          Assessment & Plan:  Hypertension-stable on amlodipine and hydrochlorothiazide. Will obtain follow-up basic metabolic panel.  Anxiety-stable on Effexor, continue same.  Hypothyroid-will obtain follow-up TSH due to complaint of fatigue.  Migraine-stable with when necessary use of Maxalt. Continue same.

## 2016-11-26 ENCOUNTER — Other Ambulatory Visit: Payer: Self-pay | Admitting: Family

## 2016-11-28 NOTE — Telephone Encounter (Signed)
Rx sent to pharmacy. LB 

## 2017-03-13 ENCOUNTER — Other Ambulatory Visit: Payer: Self-pay | Admitting: Family

## 2017-03-14 NOTE — Telephone Encounter (Signed)
Melissa please advise below request?  Last clonazepam RX: 05/02/16, #30 Last OV: 10/2016 Next OV: 05/19/17 UDS: 11/2014, moderate risk CSC: 11/2014

## 2017-04-20 ENCOUNTER — Ambulatory Visit: Payer: Self-pay

## 2017-04-20 NOTE — Telephone Encounter (Signed)
Patient called with c/o "I have a yeast infection." Patient reports the symptoms started 2 days ago, vaginal itching and discharge, and she used the 1 day OTC treatment. The treatment was ineffective, so she wanted a diflucan pill called in for her to take. She denies any pain and says the itching is severe. Based on her symptoms, protocol recommends home care management. Home care advice per protocol was given, patient verbalized understanding. Patient was called back at home and voice message left to use E-Visit if she has MyChart.   Reason for Disposition . [1] Symptoms of a yeast infection (i.e., itchy, white discharge, not bad smelling) AND    [2] feels like prior vaginal yeast infections  Answer Assessment - Initial Assessment Questions 1. SYMPTOM: "What's the main symptom you're concerned about?" (e.g., pain, itching, dryness)     Vaginal itching 2. LOCATION: "Where is the  _______ located?" (e.g., inside/outside, left/right)     Inside and outside  3. ONSET: "When did the  ________  start?"     2 days ago 4. PAIN: "Is there any pain?" If so, ask: "How bad is it?" (Scale: 1-10; mild, moderate, severe)     None 5. ITCHING: "Is there any itching?" If so, ask: "How bad is it?" (Scale: 1-10; mild, moderate, severe)     Severe 6. CAUSE: "What do you think is causing the symptoms?"     Maybe the low dose doxycycline I take for my face 7. OTHER SYMPTOMS: "Do you have any other symptoms?" (e.g., vaginal bleeding, pain with urination)     Denies 8. PREGNANCY: "Is there any chance you are pregnant?" "When was your last menstrual period?"    No-tubal ligation  7. CAUSE: "What do you think is causing the discharge?" "Have you had the same problem before? What happened then?" Normal discharge for a yeast infection 8. OTHER SYMPTOMS: "Do you have any other symptoms?" (e.g., fever, itching, vaginal bleeding, pain with urination, injury to genital area, vaginal foreign body) Denies  Protocols  used: VAGINAL [facility]

## 2017-06-06 ENCOUNTER — Other Ambulatory Visit: Payer: Self-pay | Admitting: Family

## 2017-06-23 ENCOUNTER — Ambulatory Visit: Payer: BLUE CROSS/BLUE SHIELD | Admitting: Family

## 2017-06-23 ENCOUNTER — Encounter: Payer: Self-pay | Admitting: Family

## 2017-06-23 VITALS — BP 143/86 | HR 74 | Temp 98.7°F | Resp 16 | Ht 66.0 in | Wt 174.0 lb

## 2017-06-23 DIAGNOSIS — R5383 Other fatigue: Secondary | ICD-10-CM | POA: Diagnosis not present

## 2017-06-23 DIAGNOSIS — M2550 Pain in unspecified joint: Secondary | ICD-10-CM | POA: Diagnosis not present

## 2017-06-23 DIAGNOSIS — I10 Essential (primary) hypertension: Secondary | ICD-10-CM

## 2017-06-23 DIAGNOSIS — E039 Hypothyroidism, unspecified: Secondary | ICD-10-CM

## 2017-06-23 LAB — TSH: TSH: 2.57 u[IU]/mL (ref 0.35–4.50)

## 2017-06-23 LAB — CBC WITH DIFFERENTIAL/PLATELET
Basophils Absolute: 0 10*3/uL (ref 0.0–0.1)
Basophils Relative: 0.5 % (ref 0.0–3.0)
Eosinophils Absolute: 0.2 10*3/uL (ref 0.0–0.7)
Eosinophils Relative: 2.8 % (ref 0.0–5.0)
HEMATOCRIT: 41.3 % (ref 36.0–46.0)
Hemoglobin: 14.1 g/dL (ref 12.0–15.0)
LYMPHS ABS: 1.9 10*3/uL (ref 0.7–4.0)
LYMPHS PCT: 35.3 % (ref 12.0–46.0)
MCHC: 34.3 g/dL (ref 30.0–36.0)
MCV: 93.8 fl (ref 78.0–100.0)
MONOS PCT: 6.6 % (ref 3.0–12.0)
Monocytes Absolute: 0.4 10*3/uL (ref 0.1–1.0)
NEUTROS ABS: 2.9 10*3/uL (ref 1.4–7.7)
NEUTROS PCT: 54.8 % (ref 43.0–77.0)
PLATELETS: 240 10*3/uL (ref 150.0–400.0)
RBC: 4.4 Mil/uL (ref 3.87–5.11)
RDW: 13.5 % (ref 11.5–15.5)
WBC: 5.3 10*3/uL (ref 4.0–10.5)

## 2017-06-23 LAB — SEDIMENTATION RATE: Sed Rate: 1 mm/hr (ref 0–20)

## 2017-06-23 MED ORDER — VALACYCLOVIR HCL 1 G PO TABS: ORAL_TABLET | ORAL | 0 refills | Status: DC

## 2017-06-23 MED ORDER — LISINOPRIL 10 MG PO TABS: 10.0000 mg | ORAL_TABLET | Freq: Every day | ORAL | 3 refills | Status: DC

## 2017-06-23 NOTE — Progress Notes (Signed)
Subjective:    Patient ID: Whitney King, female    DOB: Mar 16, 1971, 47 y.o.   MRN: [id number]  HPI  Pt is a 47 yr old female who presents today for follow up.  Hypothyroid- continues synthroid.  Lab Results  Component Value Date   TSH 1.81 11/16/2016   HTN-  Maintained on amlodipine and hctz.  BP Readings from Last 3 Encounters:  06/23/17 (!) 143/86  11/16/16 126/89  06/15/16 (!) 135/94   Anxiety/Depression- maintained on Effexor- Stress is improved. Feeling well.   She does report ongoingJoint pain, fatigue. Concerned about her inability to lose weight. She keeps her calories to 1200/day.  Eating healthy, works out 4 days a week. Reports that she had rheum work up a few years back.  Denies snoring. Sleeps well.    Wt Readings from Last 3 Encounters:  06/23/17 174 lb (78.9 kg)  11/16/16 164 lb 12.8 oz (74.8 kg)  06/15/16 165 lb 3.2 oz (74.9 kg)   Review of Systems See HPI  Past Medical History:  Diagnosis Date  . Anxiety   . Asthma    exercise induced, allergies, no problems since allergy shots  . Bipolar disorder ([place])   . GERD (gastroesophageal reflux disease)    diet controlled only  . Headache(784.0)    otc meds prn  . Hypertension    no medications at this time- post pregnancy  . IBS (irritable bowel syndrome)   . Mental disorder   . Migraines   . PONV (postoperative nausea and vomiting)   . Seasonal allergies   . SVD (spontaneous vaginal delivery)    x 1     Social History   Socioeconomic History  . Marital status: Legally Separated    Spouse name: Not on file  . Number of children: 2  . Years of education: Not on file  . Highest education level: Not on file  Social Needs  . Financial resource strain: Not on file  . Food insecurity - worry: Not on file  . Food insecurity - inability: Not on file  . Transportation needs - medical: Not on file  . Transportation needs - non-medical: Not on file  Occupational History  . 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   Comment: [employer]  Tobacco Use  . Smoking status: Former Smoker    Packs/day: 0.05    Years: 20.00    Pack years: 1.00    Types: Cigarettes    Last attempt to quit: 02/10/2015    Years since quitting: 2.3  . Smokeless tobacco: Never Used  Substance and Sexual Activity  . Alcohol use: Yes    Alcohol/week: 1.2 oz    Types: 2 Glasses of wine per week    Comment: socially  . Drug use: No  . Sexual activity: Yes    Birth control/protection: None  Other Topics Concern  . Not on file  Social History Narrative   Separated   2 children   Works as an Engineer, structural PA   Enjoys gym, kayak, paddle, boarding, outside activities    Past Surgical History:  Procedure Laterality Date  . COLONOSCOPY W/ POLYPECTOMY    . [place] N/A 10/17/2012   Procedure: DILATATION & CURETTAGE/HYSTEROSCOPY WITH NOVASURE ABLATION;  Surgeon: Daria Pastures, MD;  Location: [facility];  Service: Gynecology;  Laterality: N/A;  . EYE SURGERY     PRK - bilateral eyes  . LAPAROSCOPIC TUBAL LIGATION Bilateral 07/15/2015   Procedure: LAPAROSCOPIC TUBAL LIGATION;  Surgeon: Sharyn Lull  Philis Pique, MD;  Location: [facility];  Service: Gynecology;  Laterality: Bilateral;  FILSHIE CLIPS  . left leg vein surgery Bilateral    Laser  . left wrist surgery  2012   fracture repair  . LUMBAR LAMINECTOMY/DECOMPRESSION MICRODISCECTOMY N/A 10/28/2015   Procedure: LUMBAR 4-5 DECOMPRESSION ;  Surgeon: Phylliss Bob, MD;  Location: [facility];  Service: Orthopedics;  Laterality: N/A;  LUMBAR 4-5 DECOMPRESSION   . RECTOCELE REPAIR N/A 10/17/2012   Procedure: POSTERIOR REPAIR (RECTOCELE);  Surgeon: Daria Pastures, MD;  Location: [facility];  Service: Gynecology;  Laterality: N/A;  . RIGHT ANKLE SURGERY  1996   RECONSTRUCTION  . WISDOM TOOTH EXTRACTION      Family History  Problem Relation Age of Onset  . Colon cancer Mother   . Hyperlipidemia Mother   . Hypertension Mother   .  Colon cancer Father   . Heart disease Father   . Hypertension Father   . Stroke Paternal Grandfather   . Hypertension Maternal Grandmother     Allergies  Allergen Reactions  . Erythromycin Diarrhea    Current Outpatient Medications on File Prior to Visit  Medication Sig Dispense Refill  . amLODipine (NORVASC) 5 MG tablet TAKE 1 TABLET BY MOUTH DAILY 30 tablet 0  . cetirizine (ZYRTEC) 10 MG tablet Take 10 mg by mouth daily as needed for allergies.    . clonazePAM (KLONOPIN) 0.5 MG tablet TAKE 1 TABLET BY MOUTH THREE TIMES DAILY AS NEEDED FOR ANXIETY 30 tablet 0  . doxycycline (PERIOSTAT) 20 MG tablet TAKE 1 TABLET(20 MG) BY MOUTH TWICE DAILY 90 tablet 0  . hydrochlorothiazide (HYDRODIURIL) 25 MG tablet TAKE 1 TABLET(25 MG) BY MOUTH DAILY 90 tablet 1  . levothyroxine (SYNTHROID, LEVOTHROID) 50 MCG tablet TAKE 1 TABLET(50 MCG) BY MOUTH DAILY 30 tablet 0  . rizatriptan (MAXALT) 10 MG tablet Take 1 tablet (10 mg total) by mouth as needed for migraine. May repeat in 2 hours if needed 10 tablet 5  . venlafaxine XR (EFFEXOR-XR) 150 MG 24 hr capsule TAKE 1 CAPSULE BY MOUTH EVERY DAY WITH BREAKFAST 30 capsule 0   No current facility-administered medications on file prior to visit.     BP (!) 143/86 (BP Location: Right Arm, Patient Position: Sitting, Cuff Size: Large)   Pulse 74   Temp 98.7 F (37.1 C) (Oral)   Resp 16   Ht '5\' 6"'  (1.676 m)   Wt 174 lb (78.9 kg)   SpO2 100%   BMI 28.08 kg/m       Objective:   Physical Exam  Constitutional: She is oriented to person, place, and time. She appears well-developed and well-nourished.  Cardiovascular: Normal rate, regular rhythm and normal heart sounds.  No murmur heard. Pulmonary/Chest: Effort normal and breath sounds normal. No respiratory distress. She has no wheezes.  Musculoskeletal: She exhibits no edema.  Neurological: She is alert and oriented to person, place, and time.  Psychiatric: She has a normal mood and affect. Her  behavior is normal. Judgment and thought content normal.          Assessment & Plan:  Depression/anxiety- stable on effexor.  Hypothyroid- Stable on synthroid, continue same.  Lab Results  Component Value Date   TSH 2.57 06/23/2017   HTN- uncontrolled. Add lisinopril 28m.   Joint pain- ANA,  ESR- WNL. Add on Rheumatoid factor.

## 2017-06-23 NOTE — Patient Instructions (Signed)
Please add lisinopril 10mg  once daily. Complete lab work prior to leaving.

## 2017-06-25 ENCOUNTER — Telehealth: Payer: Self-pay | Admitting: Family

## 2017-06-25 NOTE — Telephone Encounter (Signed)
Could you please ask the lab to add on rheumatoid factor, dx joint pain?

## 2017-06-26 NOTE — Telephone Encounter (Signed)
Spoke with [facility] and added test.

## 2017-06-28 LAB — TEST AUTHORIZATION

## 2017-06-28 LAB — ANA: ANA: POSITIVE — AB

## 2017-06-28 LAB — RHEUMATOID FACTOR

## 2017-06-28 LAB — ANTI-NUCLEAR AB-TITER (ANA TITER)

## 2017-06-30 ENCOUNTER — Encounter: Payer: Self-pay | Admitting: Family

## 2017-06-30 DIAGNOSIS — R768 Other specified abnormal immunological findings in serum: Secondary | ICD-10-CM

## 2017-07-04 MED ORDER — DOXYCYCLINE HYCLATE 20 MG PO TABS: ORAL_TABLET | ORAL | 1 refills | Status: DC

## 2017-07-04 NOTE — Addendum Note (Signed)
Addended by: Debbrah Alar on: 07/04/2017 01:28 PM   Modules accepted: Orders

## 2017-07-13 ENCOUNTER — Other Ambulatory Visit: Payer: Self-pay | Admitting: Family

## 2017-07-14 ENCOUNTER — Ambulatory Visit: Payer: BLUE CROSS/BLUE SHIELD | Admitting: Psychology

## 2017-07-14 DIAGNOSIS — F4321 Adjustment disorder with depressed mood: Secondary | ICD-10-CM

## 2017-07-17 ENCOUNTER — Telehealth: Payer: Self-pay | Admitting: Family

## 2017-07-17 NOTE — Telephone Encounter (Signed)
Disp Refills Start End   venlafaxine XR (EFFEXOR-XR) 150 MG 24 hr capsule 90 capsule 1 07/17/2017    Sig: TAKE 1 CAPSULE BY MOUTH EVERY DAY WITH BREAKFAST   Sent to pharmacy as: venlafaxine XR (EFFEXOR-XR) 150 MG 24 hr capsule   E-Prescribing Status: Receipt confirmed by pharmacy (07/17/2017 3:47 PM EDT)    /  Gerlene Fee Refills Start End   hydrochlorothiazide (HYDRODIURIL) 25 MG tablet 90 tablet 1 07/17/2017    Sig: TAKE 1 TABLET(25 MG) BY MOUTH DAILY   Sent to pharmacy as: hydrochlorothiazide (HYDRODIURIL) 25 MG tablet   E-Prescribing Status: Receipt confirmed by pharmacy (07/17/2017 3:47 PM EDT)    /  Gerlene Fee Refills Start End   amLODipine (NORVASC) 5 MG tablet 90 tablet 1 07/17/2017    Sig: TAKE 1 TABLET BY MOUTH DAILY   Sent to pharmacy as: amLODipine (NORVASC) 5 MG tablet   E-Prescribing Status: Receipt confirmed by pharmacy (07/17/2017 3:47 PM EDT)    These requests have already been handled.

## 2017-07-17 NOTE — Telephone Encounter (Signed)
Copied from [facility]. Topic: Quick Communication - Rx Refill/Question >> Jul 17, 2017  4:41 PM Neva Seat wrote: venlafaxine XR (EFFEXOR-XR) 150 MG 24 hr capsule hydrochlorothiazide (HYDRODIURIL) 25 MG tablet amLODipine (NORVASC) 5 MG tablet  Pt is out of refills asap.  Pt is out of Rx's  [facility] [place] [facility] [place], [place] AT [address] [place] [address] Phone: [phone] Fax: [phone]

## 2017-07-18 ENCOUNTER — Other Ambulatory Visit: Payer: Self-pay | Admitting: Family

## 2017-07-26 ENCOUNTER — Encounter: Payer: Self-pay | Admitting: Family

## 2017-07-26 ENCOUNTER — Ambulatory Visit: Payer: BLUE CROSS/BLUE SHIELD | Admitting: Family

## 2017-07-26 VITALS — BP 130/80 | HR 69 | Temp 97.9°F | Resp 18 | Ht 66.0 in | Wt 173.2 lb

## 2017-07-26 DIAGNOSIS — I10 Essential (primary) hypertension: Secondary | ICD-10-CM

## 2017-07-26 DIAGNOSIS — R768 Other specified abnormal immunological findings in serum: Secondary | ICD-10-CM

## 2017-07-26 DIAGNOSIS — Z0000 Encounter for general adult medical examination without abnormal findings: Secondary | ICD-10-CM

## 2017-07-26 HISTORY — DX: Other specified abnormal immunological findings in serum: R76.8

## 2017-07-26 LAB — LIPID PANEL
CHOL/HDL RATIO: 3
Cholesterol: 231 mg/dL — ABNORMAL HIGH (ref 0–200)
HDL: 91.4 mg/dL (ref >39.00)
LDL CALC: 127 mg/dL — AB (ref 0–99)
NonHDL: 139.92
TRIGLYCERIDES: 67 mg/dL (ref 0.0–149.0)
VLDL: 13.4 mg/dL (ref 0.0–40.0)

## 2017-07-26 LAB — BASIC METABOLIC PANEL
BUN: 13 mg/dL (ref 6–23)
CALCIUM: 9.6 mg/dL (ref 8.4–10.5)
CHLORIDE: 102 meq/L (ref 96–112)
CO2: 31 meq/L (ref 19–32)
Creatinine, Ser: 0.73 mg/dL (ref 0.40–1.20)
GFR: 90.67 mL/min (ref >60.00)
Glucose, Bld: 99 mg/dL (ref 70–99)
Potassium: 5 mEq/L (ref 3.5–5.1)
SODIUM: 139 meq/L (ref 135–145)

## 2017-07-26 MED ORDER — LISINOPRIL 20 MG PO TABS: 20.0000 mg | ORAL_TABLET | Freq: Every day | ORAL | 3 refills | Status: DC

## 2017-07-26 NOTE — Patient Instructions (Signed)
Please complete lab work prior to leaving. Stop amlodipine. Increase lisinopril from 10mg  to 20mg  once daily.

## 2017-07-26 NOTE — Progress Notes (Signed)
Subjective:    Patient ID: Whitney King, female    DOB: July 14, 1970, 47 y.o.   MRN: [id number]  HPI   Patient is a 47 yr old female who presents today for follow up of her blood pressure.  1) HTN- last visit we added on lisinopril 10mg  once daily. She was continued on hctz and amlodipine. Reports that she is tolerating lisinopril without difficulty. Denies cough. She does report that she has LE edema with amlodipine- especially in the evenings which she finds to be very bothersome.   BP Readings from Last 3 Encounters:  06/23/17 (!) 143/86  11/16/16 126/89  06/15/16 (!) 135/94   ANA +- reports + fatigue and generalized pain.  Has appointment scheduled with Rheumatology- Dr. Amil Amen.      Review of Systems See HPI  Past Medical History:  Diagnosis Date  . Anxiety   . Asthma    exercise induced, allergies, no problems since allergy shots  . Bipolar disorder ([place])   . GERD (gastroesophageal reflux disease)    diet controlled only  . Headache(784.0)    otc meds prn  . Hypertension    no medications at this time- post pregnancy  . IBS (irritable bowel syndrome)   . Mental disorder   . Migraines   . PONV (postoperative nausea and vomiting)   . Seasonal allergies   . SVD (spontaneous vaginal delivery)    x 1     Social History   Socioeconomic History  . Marital status: Legally Separated    Spouse name: Not on file  . Number of children: 2  . Years of education: Not on file  . Highest education level: Not on file  Occupational History  . 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 Financial resource strain: Not on file  . Food insecurity:    Worry: Not on file    Inability: Not on file  . Transportation needs:    Medical: Not on file    Non-medical: Not on file  Tobacco Use  . Smoking status: Former Smoker    Packs/day: 0.05    Years: 20.00    Pack years: 1.00    Types: Cigarettes    Last attempt to quit: 02/10/2015    Years  since quitting: 2.4  . Smokeless tobacco: Never Used  Substance and Sexual Activity  . Alcohol use: Yes    Alcohol/week: 1.2 oz    Types: 2 Glasses of wine per week    Comment: socially  . Drug use: No  . Sexual activity: Yes    Birth control/protection: None  Lifestyle  . Physical activity:    Days per week: Not on file    Minutes per session: Not on file  . Stress: Not on file  Relationships  . Social connections:    Talks on phone: Not on file    Gets together: Not on file    Attends religious service: Not on file    Active member of club or organization: Not on file    Attends meetings of clubs or organizations: Not on file    Relationship status: Not on file  . Intimate partner violence:    Fear of current or ex partner: Not on file    Emotionally abused: Not on file    Physically abused: Not on file    Forced sexual activity: Not on file  Other Topics Concern  . Not on file  Social History Narrative  Separated   2 children   Works as an Engineer, structural PA   Enjoys gym, kayak, paddle, boarding, outside activities    Past Surgical History:  Procedure Laterality Date  . COLONOSCOPY W/ POLYPECTOMY    . [place] N/A 10/17/2012   Procedure: DILATATION & CURETTAGE/HYSTEROSCOPY WITH NOVASURE ABLATION;  Surgeon: Daria Pastures, MD;  Location: [facility];  Service: Gynecology;  Laterality: N/A;  . EYE SURGERY     PRK - bilateral eyes  . LAPAROSCOPIC TUBAL LIGATION Bilateral 07/15/2015   Procedure: LAPAROSCOPIC TUBAL LIGATION;  Surgeon: Bobbye Charleston, MD;  Location: [facility];  Service: Gynecology;  Laterality: Bilateral;  FILSHIE CLIPS  . left leg vein surgery Bilateral    Laser  . left wrist surgery  2012   fracture repair  . LUMBAR LAMINECTOMY/DECOMPRESSION MICRODISCECTOMY N/A 10/28/2015   Procedure: LUMBAR 4-5 DECOMPRESSION ;  Surgeon: Phylliss Bob, MD;  Location: [facility];  Service: Orthopedics;  Laterality: N/A;   LUMBAR 4-5 DECOMPRESSION   . RECTOCELE REPAIR N/A 10/17/2012   Procedure: POSTERIOR REPAIR (RECTOCELE);  Surgeon: Daria Pastures, MD;  Location: [facility];  Service: Gynecology;  Laterality: N/A;  . RIGHT ANKLE SURGERY  1996   RECONSTRUCTION  . WISDOM TOOTH EXTRACTION      Family History  Problem Relation Age of Onset  . Colon cancer Mother   . Hyperlipidemia Mother   . Hypertension Mother   . Colon cancer Father   . Heart disease Father   . Hypertension Father   . Stroke Paternal Grandfather   . Hypertension Maternal Grandmother     Allergies  Allergen Reactions  . Erythromycin Diarrhea    Current Outpatient Medications on File Prior to Visit  Medication Sig Dispense Refill  . amLODipine (NORVASC) 5 MG tablet TAKE 1 TABLET BY MOUTH DAILY 90 tablet 1  . cetirizine (ZYRTEC) 10 MG tablet Take 10 mg by mouth daily as needed for allergies.    . clonazePAM (KLONOPIN) 0.5 MG tablet TAKE 1 TABLET BY MOUTH THREE TIMES DAILY AS NEEDED FOR ANXIETY 30 tablet 0  . doxycycline (PERIOSTAT) 20 MG tablet TAKE 1 TABLET(20 MG) BY MOUTH TWICE DAILY 180 tablet 1  . hydrochlorothiazide (HYDRODIURIL) 25 MG tablet TAKE 1 TABLET(25 MG) BY MOUTH DAILY 90 tablet 1  . levothyroxine (SYNTHROID, LEVOTHROID) 50 MCG tablet TAKE 1 TABLET(50 MCG) BY MOUTH DAILY 30 tablet 5  . lisinopril (PRINIVIL,ZESTRIL) 10 MG tablet Take 1 tablet (10 mg total) by mouth daily. 30 tablet 3  . rizatriptan (MAXALT) 10 MG tablet Take 1 tablet (10 mg total) by mouth as needed for migraine. May repeat in 2 hours if needed 10 tablet 5  . valACYclovir (VALTREX) 1000 MG tablet 2 tabs by mouth at start of cold sore then 2 tabs 12 hours later 20 tablet 0  . venlafaxine XR (EFFEXOR-XR) 150 MG 24 hr capsule TAKE 1 CAPSULE BY MOUTH EVERY DAY WITH BREAKFAST 90 capsule 1   No current facility-administered medications on file prior to visit.     Pulse 69   Temp 97.9 F (36.6 C) (Oral)   Resp 18   Ht 5\' 6"  (1.676 m)   Wt 173 lb 3.2 oz  (78.6 kg)   SpO2 100%   BMI 27.96 kg/m       Objective:   Physical Exam  Constitutional: She appears well-developed and well-nourished.  Cardiovascular: Normal rate, regular rhythm and normal heart sounds.  No murmur heard. Pulmonary/Chest: Effort normal and breath sounds  normal. No respiratory distress. She has no wheezes.  Musculoskeletal:  Trace bilateral LE edema   Psychiatric: She has a normal mood and affect. Her behavior is normal. Judgment and thought content normal.          Assessment & Plan:  HTN-  Will d/c amlodipine at pt  Request due to LE edema.  Increase lisinopril from 10mg  to 20mg .  Check follow up bmet. Continue hctz, follow up in 1 month.  Positive ANA- has upcoming consult with rheumatology. Encouraged pt to keep this appointment.  Pt requesting lipid panel for her insurance.

## 2017-07-28 ENCOUNTER — Ambulatory Visit: Payer: BLUE CROSS/BLUE SHIELD | Admitting: Psychology

## 2017-07-28 DIAGNOSIS — F4321 Adjustment disorder with depressed mood: Secondary | ICD-10-CM | POA: Diagnosis not present

## 2017-08-18 ENCOUNTER — Ambulatory Visit: Payer: BLUE CROSS/BLUE SHIELD | Admitting: Psychology

## 2017-08-18 DIAGNOSIS — F4321 Adjustment disorder with depressed mood: Secondary | ICD-10-CM | POA: Diagnosis not present

## 2017-08-23 ENCOUNTER — Ambulatory Visit: Payer: BLUE CROSS/BLUE SHIELD | Admitting: Family

## 2017-08-23 ENCOUNTER — Encounter: Payer: Self-pay | Admitting: Family

## 2017-08-23 DIAGNOSIS — Z0289 Encounter for other administrative examinations: Secondary | ICD-10-CM

## 2017-09-22 ENCOUNTER — Ambulatory Visit (INDEPENDENT_AMBULATORY_CARE_PROVIDER_SITE_OTHER): Payer: BLUE CROSS/BLUE SHIELD | Admitting: Psychology

## 2017-09-22 DIAGNOSIS — F4321 Adjustment disorder with depressed mood: Secondary | ICD-10-CM

## 2017-10-10 ENCOUNTER — Telehealth: Payer: Self-pay | Admitting: Endocrinology

## 2017-10-10 NOTE — Telephone Encounter (Signed)
-----   Message from Brunilda Payor sent at 10/10/2017  3:39 PM EDT ----- Regarding: referral Please review

## 2017-10-10 NOTE — Telephone Encounter (Signed)
Is this re: a referral to [place]?

## 2017-10-11 NOTE — Telephone Encounter (Signed)
I can't see where pt was referred to [place], or the reason.

## 2017-10-11 NOTE — Telephone Encounter (Signed)
This was a referral that needed review? I think that Mardene Celeste just needed to know if it was approved or reviewed?

## 2017-10-27 ENCOUNTER — Ambulatory Visit: Payer: BLUE CROSS/BLUE SHIELD | Admitting: Psychology

## 2017-11-30 ENCOUNTER — Other Ambulatory Visit: Payer: Self-pay | Admitting: Family

## 2018-01-02 ENCOUNTER — Telehealth: Payer: Self-pay | Admitting: Family

## 2018-01-02 ENCOUNTER — Encounter: Payer: Self-pay | Admitting: Family

## 2018-01-02 NOTE — Telephone Encounter (Signed)
2 week supply of lisinopril was sent to pharmacy. Pt is past due for f/u with Melissa and needs to be seen before further quantities / refills can be given. Mychart message sent to pt to schedule appt soon.

## 2018-01-04 ENCOUNTER — Other Ambulatory Visit: Payer: Self-pay | Admitting: Obstetrics and Gynecology

## 2018-01-04 DIAGNOSIS — R928 Other abnormal and inconclusive findings on diagnostic imaging of breast: Secondary | ICD-10-CM

## 2018-01-08 ENCOUNTER — Encounter: Payer: Self-pay | Admitting: Family

## 2018-01-08 ENCOUNTER — Ambulatory Visit (INDEPENDENT_AMBULATORY_CARE_PROVIDER_SITE_OTHER): Payer: PRIVATE HEALTH INSURANCE | Admitting: Family

## 2018-01-08 VITALS — BP 130/90 | HR 73 | Temp 98.6°F | Resp 16 | Ht 66.0 in | Wt 179.0 lb

## 2018-01-08 DIAGNOSIS — F419 Anxiety disorder, unspecified: Secondary | ICD-10-CM | POA: Diagnosis not present

## 2018-01-08 DIAGNOSIS — E039 Hypothyroidism, unspecified: Secondary | ICD-10-CM | POA: Diagnosis not present

## 2018-01-08 DIAGNOSIS — G9520 Unspecified cord compression: Secondary | ICD-10-CM | POA: Diagnosis not present

## 2018-01-08 DIAGNOSIS — I10 Essential (primary) hypertension: Secondary | ICD-10-CM | POA: Diagnosis not present

## 2018-01-08 MED ORDER — HYDROCHLOROTHIAZIDE 25 MG PO TABS: 25.0000 mg | ORAL_TABLET | Freq: Every day | ORAL | 1 refills | Status: DC

## 2018-01-08 MED ORDER — LEVOTHYROXINE SODIUM 50 MCG PO TABS: 50.0000 ug | ORAL_TABLET | Freq: Every day | ORAL | 1 refills | Status: DC

## 2018-01-08 MED ORDER — VENLAFAXINE HCL ER 150 MG PO CP24: 150.0000 mg | ORAL_CAPSULE | Freq: Every day | ORAL | 1 refills | Status: DC

## 2018-01-08 MED ORDER — VALACYCLOVIR HCL 1 G PO TABS: ORAL_TABLET | ORAL | 1 refills | Status: DC

## 2018-01-08 MED ORDER — LISINOPRIL 20 MG PO TABS: 20.0000 mg | ORAL_TABLET | Freq: Every day | ORAL | 1 refills | Status: DC

## 2018-01-08 MED ORDER — RIZATRIPTAN BENZOATE 10 MG PO TABS: 10.0000 mg | ORAL_TABLET | ORAL | 5 refills | Status: DC | PRN

## 2018-01-08 NOTE — Patient Instructions (Signed)
- 

## 2018-01-08 NOTE — Progress Notes (Signed)
Subjective:    Patient ID: Whitney King, female    DOB: 10-12-1970, 47 y.o.   MRN: [id number]  HPI  HTN- maintained on lisinopril 20mg . No cough.  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  She reports that she slipped off of her paddle board and flung her head back.  She suddenly had numbness and upper extremity paralysis.  She was able to kick her feet to clear the water and call for help.  [place]  She was treated with steroids and ultimately released.  She has been following with Dr. Lynann Bologna.  She has pain but is not having any current neurologic deficits.  They are considering a 4 level ACDF.  Denies bowel/bladder incontinence.  Anxiety-reports symptoms are stable.  She is rarely needing to use clonazepam.  Review of Systems    see HPI  Past Medical History:  Diagnosis Date  . Anxiety   . Asthma    exercise induced, allergies, no problems since allergy shots  . Bipolar disorder ([place])   . GERD (gastroesophageal reflux disease)    diet controlled only  . Headache(784.0)    otc meds prn  . Hypertension    no medications at this time- post pregnancy  . IBS (irritable bowel syndrome)   . Mental disorder   . Migraines   . PONV (postoperative nausea and vomiting)   . Seasonal allergies   . SVD (spontaneous vaginal delivery)    x 1     Social History   Socioeconomic History  . Marital status: Legally Separated    Spouse name: Not on file  . Number of children: 2  . Years of education: Not on file  . Highest education level: Not on file  Occupational History  . 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 Financial resource strain: Not on file  . Food insecurity:    Worry: Not on file    Inability: Not on file  . Transportation needs:    Medical: Not on file   Non-medical: Not on file  Tobacco Use  . Smoking status: Former Smoker    Packs/day: 0.05    Years: 20.00    Pack years: 1.00    Types: Cigarettes    Last attempt to quit: 02/10/2015    Years since quitting: 2.9  . Smokeless tobacco: Never Used  Substance and Sexual Activity  . Alcohol use: Yes    Alcohol/week: 2.0 standard drinks    Types: 2 Glasses of wine per week    Comment: socially  . Drug use: No  . Sexual activity: Yes    Birth control/protection: None  Lifestyle  . Physical activity:    Days per week: Not on file    Minutes per session: Not on file  . Stress: Not on file  Relationships  . Social connections:    Talks on phone: Not on file    Gets together: Not on file    Attends religious service: Not on file    Active member of club or organization: Not on file    Attends meetings of clubs or organizations: Not on file    Relationship status: Not on file  . Intimate partner violence:    Fear of current or ex partner: Not on file    Emotionally abused:  Not on file    Physically abused: Not on file    Forced sexual activity: Not on file  Other Topics Concern  . Not on file  Social History Narrative   Separated   2 children   Works as an Engineer, structural PA   Enjoys gym, kayak, paddle, boarding, outside activities    Past Surgical History:  Procedure Laterality Date  . COLONOSCOPY W/ POLYPECTOMY    . [place] N/A 10/17/2012   Procedure: DILATATION & CURETTAGE/HYSTEROSCOPY WITH NOVASURE ABLATION;  Surgeon: Daria Pastures, MD;  Location: [facility];  Service: Gynecology;  Laterality: N/A;  . EYE SURGERY     PRK - bilateral eyes  . LAPAROSCOPIC TUBAL LIGATION Bilateral 07/15/2015   Procedure: LAPAROSCOPIC TUBAL LIGATION;  Surgeon: Bobbye Charleston, MD;  Location: [facility];  Service: Gynecology;  Laterality: Bilateral;  FILSHIE CLIPS  . left leg vein surgery Bilateral    Laser  . left wrist surgery  2012    fracture repair  . LUMBAR LAMINECTOMY/DECOMPRESSION MICRODISCECTOMY N/A 10/28/2015   Procedure: LUMBAR 4-5 DECOMPRESSION ;  Surgeon: Phylliss Bob, MD;  Location: [facility];  Service: Orthopedics;  Laterality: N/A;  LUMBAR 4-5 DECOMPRESSION   . RECTOCELE REPAIR N/A 10/17/2012   Procedure: POSTERIOR REPAIR (RECTOCELE);  Surgeon: Daria Pastures, MD;  Location: [facility];  Service: Gynecology;  Laterality: N/A;  . RIGHT ANKLE SURGERY  1996   RECONSTRUCTION  . WISDOM TOOTH EXTRACTION      Family History  Problem Relation Age of Onset  . Colon cancer Mother   . Hyperlipidemia Mother   . Hypertension Mother   . Colon cancer Father   . Heart disease Father   . Hypertension Father   . Stroke Paternal Grandfather   . Hypertension Maternal Grandmother     Allergies  Allergen Reactions  . Erythromycin Diarrhea    Current Outpatient Medications on File Prior to Visit  Medication Sig Dispense Refill  . cetirizine (ZYRTEC) 10 MG tablet Take 10 mg by mouth daily as needed for allergies.    . clonazePAM (KLONOPIN) 0.5 MG tablet TAKE 1 TABLET BY MOUTH THREE TIMES DAILY AS NEEDED FOR ANXIETY 30 tablet 0  . doxycycline (PERIOSTAT) 20 MG tablet TAKE 1 TABLET(20 MG) BY MOUTH TWICE DAILY 180 tablet 1  . estradiol (ESTRACE) 0.5 MG tablet TK 1 T PO QD  4  . gabapentin (NEURONTIN) 300 MG capsule gabapentin 300 mg capsule    . meloxicam (MOBIC) 15 MG tablet meloxicam 15 mg tablet     No current facility-administered medications on file prior to visit.     BP 130/90 (BP Location: Right Arm, Patient Position: Sitting, Cuff Size: Small)   Pulse 73   Temp 98.6 F (37 C) (Oral)   Resp 16   Ht 5\' 6"  (1.676 m)   Wt 179 lb (81.2 kg)   SpO2 99%   BMI 28.89 kg/m    Objective:   Physical Exam  Constitutional: She is oriented to person, place, and time. She appears well-developed and well-nourished.  Cardiovascular: Normal rate, regular rhythm and normal heart sounds.  No murmur heard. Pulmonary/Chest:  Effort normal and breath sounds normal. No respiratory distress. She has no wheezes.  Neurological: She is alert and oriented to person, place, and time. She exhibits normal muscle tone.  Skin: Skin is warm and dry.  Psychiatric: She has a normal mood and affect. Her behavior is normal. Judgment and thought content normal.  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  This is being managed closely by her neurosurgeon.  Hypertension-blood pressure is stable.  Continue lisinopril. Check follow up bmet.   Anxiety-this is stable.  She declines refill on Klonopin today and also declines urine drug screen.  Hypothyroid- obtain follow up tsh. Continue synthroid.

## 2018-01-09 ENCOUNTER — Ambulatory Visit
Admission: RE | Admit: 2018-01-09 | Discharge: 2018-01-09 | Disposition: A | Discharge status: Home / Self Care | Payer: PRIVATE HEALTH INSURANCE | Source: Ambulatory Visit | Attending: Obstetrics and Gynecology | Admitting: Obstetrics and Gynecology

## 2018-01-09 DIAGNOSIS — R928 Other abnormal and inconclusive findings on diagnostic imaging of breast: Secondary | ICD-10-CM

## 2018-05-09 ENCOUNTER — Ambulatory Visit (INDEPENDENT_AMBULATORY_CARE_PROVIDER_SITE_OTHER): Payer: PRIVATE HEALTH INSURANCE | Admitting: Family

## 2018-05-09 ENCOUNTER — Encounter: Payer: Self-pay | Admitting: Family

## 2018-05-09 VITALS — BP 125/84 | HR 70 | Temp 98.3°F | Resp 16 | Ht 66.0 in | Wt 178.0 lb

## 2018-05-09 DIAGNOSIS — R9431 Abnormal electrocardiogram [ECG] [EKG]: Secondary | ICD-10-CM

## 2018-05-09 DIAGNOSIS — Z0001 Encounter for general adult medical examination with abnormal findings: Secondary | ICD-10-CM

## 2018-05-09 DIAGNOSIS — Z0000 Encounter for general adult medical examination without abnormal findings: Secondary | ICD-10-CM | POA: Diagnosis not present

## 2018-05-09 LAB — LIPID PANEL
Cholesterol: 221 mg/dL — ABNORMAL HIGH (ref 0–200)
HDL: 79.2 mg/dL (ref >39.00)
LDL Cholesterol: 124 mg/dL — ABNORMAL HIGH (ref 0–99)
NonHDL: 141.9
Total CHOL/HDL Ratio: 3
Triglycerides: 88 mg/dL (ref 0.0–149.0)
VLDL: 17.6 mg/dL (ref 0.0–40.0)

## 2018-05-09 LAB — CBC WITH DIFFERENTIAL/PLATELET
BASOS PCT: 0.9 % (ref 0.0–3.0)
Basophils Absolute: 0.1 10*3/uL (ref 0.0–0.1)
Eosinophils Absolute: 0.2 10*3/uL (ref 0.0–0.7)
Eosinophils Relative: 2.9 % (ref 0.0–5.0)
HCT: 40.9 % (ref 36.0–46.0)
Hemoglobin: 13.9 g/dL (ref 12.0–15.0)
Lymphocytes Relative: 28.5 % (ref 12.0–46.0)
Lymphs Abs: 1.8 10*3/uL (ref 0.7–4.0)
MCHC: 34.1 g/dL (ref 30.0–36.0)
MCV: 92 fl (ref 78.0–100.0)
MONO ABS: 0.5 10*3/uL (ref 0.1–1.0)
Monocytes Relative: 8.3 % (ref 3.0–12.0)
Neutro Abs: 3.7 10*3/uL (ref 1.4–7.7)
Neutrophils Relative %: 59.4 % (ref 43.0–77.0)
PLATELETS: 208 10*3/uL (ref 150.0–400.0)
RBC: 4.44 Mil/uL (ref 3.87–5.11)
RDW: 13.1 % (ref 11.5–15.5)
WBC: 6.3 10*3/uL (ref 4.0–10.5)

## 2018-05-09 LAB — BASIC METABOLIC PANEL
BUN: 17 mg/dL (ref 6–23)
CALCIUM: 9.4 mg/dL (ref 8.4–10.5)
CO2: 30 meq/L (ref 19–32)
CREATININE: 0.81 mg/dL (ref 0.40–1.20)
Chloride: 103 mEq/L (ref 96–112)
GFR: 80.15 mL/min (ref >60.00)
Glucose, Bld: 98 mg/dL (ref 70–99)
Potassium: 4 mEq/L (ref 3.5–5.1)
Sodium: 137 mEq/L (ref 135–145)

## 2018-05-09 LAB — HEPATIC FUNCTION PANEL
ALT: 13 U/L (ref 0–35)
AST: 16 U/L (ref 0–37)
Albumin: 3.8 g/dL (ref 3.5–5.2)
Alkaline Phosphatase: 36 U/L — ABNORMAL LOW (ref 39–117)
Bilirubin, Direct: 0.1 mg/dL (ref 0.0–0.3)
Total Bilirubin: 0.4 mg/dL (ref 0.2–1.2)
Total Protein: 6.3 g/dL (ref 6.0–8.3)

## 2018-05-09 LAB — TSH: TSH: 1.92 u[IU]/mL (ref 0.35–4.50)

## 2018-05-09 NOTE — Progress Notes (Signed)
Subjective:    Patient ID: Whitney King, female    DOB: 1971/02/12, 48 y.o.   MRN: [id number]  HPI  Patient is a 48 yr old female who presents today for complete physical.  Patient presents today for complete physical.  Immunizations:  Up to date Diet: healthy Exercise: 4-5 times a week Wt Readings from Last 3 Encounters:  05/09/18 178 lb (80.7 kg)  01/08/18 179 lb (81.2 kg)  07/26/17 173 lb 3.2 oz (78.6 kg)  Colonoscopy: due 02/09/20 Pap Smear:  Due 10/24/19 Mammogram: 7/19, up to date Vision: last year Dental: up to date     Review of Systems  Constitutional: Negative for unexpected weight change.  HENT: Negative for hearing loss and rhinorrhea.   Eyes: Negative for visual disturbance.  Respiratory: Negative for cough and shortness of breath.   Cardiovascular: Negative for chest pain and leg swelling.  Gastrointestinal: Negative for blood in stool, constipation and diarrhea.  Genitourinary: Negative for dysuria, frequency and hematuria.  Musculoskeletal: Negative for arthralgias and myalgias.  Skin: Negative for rash.  Neurological:       Rare migraines resolve with maxalt  Hematological: Negative for adenopathy.  Psychiatric/Behavioral:       Reports good mood.     Past Medical History:  Diagnosis Date  . Anxiety   . Asthma    exercise induced, allergies, no problems since allergy shots  . Bipolar disorder ([place])   . GERD (gastroesophageal reflux disease)    diet controlled only  . Headache(784.0)    otc meds prn  . Hypertension    no medications at this time- post pregnancy  . IBS (irritable bowel syndrome)   . Mental disorder   . Migraines   . PONV (postoperative nausea and vomiting)   . Seasonal allergies   . SVD (spontaneous vaginal delivery)    x 1     Social History   Socioeconomic History  . Marital status: Legally Separated    Spouse name: Not on file  . Number of children: 2  . Years of education: Not on file  . Highest education  level: Not on file  Occupational History  . 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 Financial resource strain: Not on file  . Food insecurity:    Worry: Not on file    Inability: Not on file  . Transportation needs:    Medical: Not on file    Non-medical: Not on file  Tobacco Use  . Smoking status: Former Smoker    Packs/day: 0.05    Years: 20.00    Pack years: 1.00    Types: Cigarettes    Last attempt to quit: 02/10/2015    Years since quitting: 3.2  . Smokeless tobacco: Never Used  Substance and Sexual Activity  . Alcohol use: Yes    Alcohol/week: 2.0 standard drinks    Types: 2 Glasses of wine per week    Comment: socially  . Drug use: No  . Sexual activity: Yes    Birth control/protection: None  Lifestyle  . Physical activity:    Days per week: Not on file    Minutes per session: Not on file  . Stress: Not on file  Relationships  . Social connections:    Talks on phone: Not on file    Gets together: Not on file    Attends religious service: Not on file    Active member of club or organization: Not on file  Attends meetings of clubs or organizations: Not on file    Relationship status: Not on file  . Intimate partner violence:    Fear of current or ex partner: Not on file    Emotionally abused: Not on file    Physically abused: Not on file    Forced sexual activity: Not on file  Other Topics Concern  . Not on file  Social History Narrative   Separated   2 children   Works as an Engineer, structural PA   Enjoys gym, kayak, paddle, boarding, outside activities    Past Surgical History:  Procedure Laterality Date  . COLONOSCOPY W/ POLYPECTOMY    . [place] N/A 10/17/2012   Procedure: DILATATION & CURETTAGE/HYSTEROSCOPY WITH NOVASURE ABLATION;  Surgeon: Daria Pastures, MD;  Location: [facility];  Service: Gynecology;  Laterality: N/A;  . EYE SURGERY     PRK - bilateral eyes  .  LAPAROSCOPIC TUBAL LIGATION Bilateral 07/15/2015   Procedure: LAPAROSCOPIC TUBAL LIGATION;  Surgeon: Bobbye Charleston, MD;  Location: [facility];  Service: Gynecology;  Laterality: Bilateral;  FILSHIE CLIPS  . left leg vein surgery Bilateral    Laser  . left wrist surgery  2012   fracture repair  . LUMBAR LAMINECTOMY/DECOMPRESSION MICRODISCECTOMY N/A 10/28/2015   Procedure: LUMBAR 4-5 DECOMPRESSION ;  Surgeon: Phylliss Bob, MD;  Location: [facility];  Service: Orthopedics;  Laterality: N/A;  LUMBAR 4-5 DECOMPRESSION   . RECTOCELE REPAIR N/A 10/17/2012   Procedure: POSTERIOR REPAIR (RECTOCELE);  Surgeon: Daria Pastures, MD;  Location: [facility];  Service: Gynecology;  Laterality: N/A;  . RIGHT ANKLE SURGERY  1996   RECONSTRUCTION  . WISDOM TOOTH EXTRACTION      Family History  Problem Relation Age of Onset  . Colon cancer Mother   . Hyperlipidemia Mother   . Hypertension Mother   . Colon cancer Father   . Heart disease Father   . Hypertension Father   . Stroke Paternal Grandfather   . Hypertension Maternal Grandmother     Allergies  Allergen Reactions  . Erythromycin Diarrhea    Current Outpatient Medications on File Prior to Visit  Medication Sig Dispense Refill  . clonazePAM (KLONOPIN) 0.5 MG tablet TAKE 1 TABLET BY MOUTH THREE TIMES DAILY AS NEEDED FOR ANXIETY 30 tablet 0  . estradiol (ESTRACE) 0.5 MG tablet TK 1 T PO QD  4  . hydrochlorothiazide (HYDRODIURIL) 25 MG tablet Take 1 tablet (25 mg total) by mouth daily. 90 tablet 1  . levothyroxine (SYNTHROID, LEVOTHROID) 50 MCG tablet Take 1 tablet (50 mcg total) by mouth daily before breakfast. 90 tablet 1  . lisinopril (PRINIVIL,ZESTRIL) 20 MG tablet Take 1 tablet (20 mg total) by mouth daily. 90 tablet 1  . rizatriptan (MAXALT) 10 MG tablet Take 1 tablet (10 mg total) by mouth as needed for migraine. May repeat in 2 hours if needed 10 tablet 5  . valACYclovir (VALTREX) 1000 MG tablet 2 tabs by mouth at start of cold sore then 2 tabs 12  hours later 20 tablet 1  . venlafaxine XR (EFFEXOR-XR) 150 MG 24 hr capsule Take 1 capsule (150 mg total) by mouth daily with breakfast. 90 capsule 1   No current facility-administered medications on file prior to visit.     BP 125/84 (BP Location: Right Arm, Patient Position: Sitting, Cuff Size: Small)   Pulse 70   Temp 98.3 F (36.8 C) (Oral)   Resp 16   Ht 5\' 6"  (1.676  m)   Wt 178 lb (80.7 kg)   SpO2 100%   BMI 28.73 kg/m       Objective:   Physical Exam  Physical Exam  Constitutional: She is oriented to person, place, and time. She appears well-developed and well-nourished. No distress.  HENT:  Head: Normocephalic and atraumatic.  Right Ear: Tympanic membrane and ear canal normal.  Left Ear: Tympanic membrane and ear canal normal.  Mouth/Throat: Oropharynx is clear and moist.  Eyes: Pupils are equal, round, and reactive to light. No scleral icterus.  Neck: Normal range of motion. No thyromegaly present.  Cardiovascular: Normal rate and regular rhythm.   No murmur heard. Pulmonary/Chest: Effort normal and breath sounds normal. No respiratory distress. He has no wheezes. She has no rales. She exhibits no tenderness.  Abdominal: Soft. Bowel sounds are normal. She exhibits no distension and no mass. There is no tenderness. There is no rebound and no guarding.  Musculoskeletal: She exhibits no edema.  Lymphadenopathy:    She has no cervical adenopathy.  Neurological: She is alert and oriented to person, place, and time. She has normal patellar reflexes. She exhibits normal muscle tone. Coordination normal.  Skin: Skin is warm and dry.  Psychiatric: She has a normal mood and affect. Her behavior is normal. Judgment and thought content normal.  Breast/pelvic: deferred        Assessment & Plan:   Preventative care- encouraged pt to continue healthy diet and regular exercise.  Mammo/pap/colo and immunizations reviewed and up to date.  Obtain routine lab work.  Abnormal  EKG- EKG is performed and personally reviewed. When compared to EKG on file note is made of new TWI in lead III and some ST depression in V4- V6.  She is asymptomatic. Will refer for 2D echo.       Assessment & Plan:

## 2018-05-09 NOTE — Patient Instructions (Signed)
You should be contacted about scheduling your echocardiogram.  Continue healthy diet and regular exercise.

## 2018-05-09 NOTE — Progress Notes (Signed)
e

## 2018-05-10 ENCOUNTER — Telehealth: Payer: Self-pay | Admitting: *Deleted

## 2018-05-10 NOTE — Telephone Encounter (Signed)
Received Medical records from [facility]; forwarded to provider/SLS 01/16

## 2018-05-21 ENCOUNTER — Encounter: Payer: Self-pay | Admitting: Family

## 2018-05-21 MED ORDER — DOXYCYCLINE 40 MG PO CPDR: 40.0000 mg | DELAYED_RELEASE_CAPSULE | ORAL | 5 refills | Status: DC

## 2018-05-23 ENCOUNTER — Other Ambulatory Visit (HOSPITAL_BASED_OUTPATIENT_CLINIC_OR_DEPARTMENT_OTHER): Payer: Self-pay

## 2018-05-28 ENCOUNTER — Encounter: Payer: Self-pay | Admitting: Family

## 2018-05-29 ENCOUNTER — Encounter: Payer: Self-pay | Admitting: Family

## 2018-05-29 MED ORDER — DOXYCYCLINE HYCLATE 20 MG PO TABS: 20.0000 mg | ORAL_TABLET | Freq: Two times a day (BID) | ORAL | 5 refills | Status: DC

## 2018-06-13 ENCOUNTER — Telehealth: Payer: Self-pay

## 2018-06-13 NOTE — Telephone Encounter (Signed)
Copied from [place] [phone]. Topic: General - Other >> May 29, 2018  3:05 PM Sheran Luz wrote: Reason for CRM: Patient called to request envelope waiting for her- states it is EKG results. She inquired if this can be faxed to her. Patient is requesting a call back if this is possible. Please advise.    ATTN: Cristle Fax#[phone]   Lm a few days ago about this and did not receive call back from patient. Lm again today.

## 2018-06-13 NOTE — Telephone Encounter (Signed)
ekg faxed to pt

## 2018-07-12 ENCOUNTER — Other Ambulatory Visit: Payer: Self-pay | Admitting: Family

## 2018-07-16 ENCOUNTER — Encounter: Payer: Self-pay | Admitting: Family

## 2018-07-16 DIAGNOSIS — R9431 Abnormal electrocardiogram [ECG] [EKG]: Secondary | ICD-10-CM

## 2018-07-20 ENCOUNTER — Ambulatory Visit (INDEPENDENT_AMBULATORY_CARE_PROVIDER_SITE_OTHER): Payer: PRIVATE HEALTH INSURANCE | Admitting: Psychology

## 2018-07-20 DIAGNOSIS — F4321 Adjustment disorder with depressed mood: Secondary | ICD-10-CM

## 2018-07-22 ENCOUNTER — Telehealth: Payer: Self-pay | Admitting: Family Medicine

## 2018-07-22 ENCOUNTER — Telehealth: Payer: PRIVATE HEALTH INSURANCE | Admitting: Nurse Practitioner

## 2018-07-22 DIAGNOSIS — Z20822 Contact with and (suspected) exposure to covid-19: Secondary | ICD-10-CM

## 2018-07-22 DIAGNOSIS — R6889 Other general symptoms and signs: Principal | ICD-10-CM

## 2018-07-22 NOTE — Telephone Encounter (Addendum)
Received a call from the team health RN. She noted this patient has developed fever with shortness of breath. Fever to 101.5 F. She notes the shortness of breath has been worsening throughout the day today. She is a orthopedic physician assistant. Given patients symptoms with fever and progressive shortness of breath I advised that she be evaluated in the ED to consider Covid19 testing. I advised that she could go to [facility] or [facility] and that there should be triage tents for her present to and give them her symptoms. The RN noted she would advise the patient.

## 2018-07-22 NOTE — Telephone Encounter (Addendum)
Called patient to see if she was going to go to the ED. Spoke with patient over the phone. She noted that she was hesitant to go to the ED at this time and sit there and wait to possibly be tested. She noted that she could breath at this time and that she was going to stay home for now and not go to the ED for evaluation, though did think she needed to be tested. 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 I discussed that I would send this message to her PCP to see if there is any possibility of testing through their office or another option for testing at [facility]. Discussed self isolation. Advised self isolation for her fiance as well at least until she is able to get a test result. Advised that if her breathing worsened she would need to present to the ED.

## 2018-07-22 NOTE — Progress Notes (Signed)
E-Visit for Corona Virus Screening  Based on your current symptoms, you may very well have the virus, however your symptoms are mild. Currently, not all patients are being tested. If the symptoms are mild and there is not a known exposure, performing the test is not indicated.  Coronavirus disease 2019 (COVID-19) is a respiratory illness that can spread from person to person. The virus that causes COVID-19 is a new virus that was first identified in the country of [place] but is now found in multiple other countries and has spread to the [place].  Symptoms associated with the virus are mild to severe fever, cough, and shortness of breath. There is currently no vaccine to protect against COVID-19, and there is no specific antiviral treatment for the virus.   To be considered HIGH RISK for Coronavirus (COVID-19), you have to meet the following criteria:  . Traveled to [place], [place], [place], [place] or [place]; or in the [place] to [place], [place], [place], or [place]; and have fever, cough, and shortness of breath within the last 2 weeks of travel OR  . Been in close contact with a person diagnosed with COVID-19 within the last 2 weeks and have fever, cough, and shortness of breath  . IF YOU DO NOT MEET THESE CRITERIA, YOU ARE CONSIDERED LOW RISK FOR COVID-19.   It is vitally important that if you feel that you have an infection such as this virus or any other virus that you stay home and away from places where you may spread it to others.  You should self-quarantine for 14 days if you have symptoms that could potentially be coronavirus and avoid contact with people age 65 and older.   You can use medication such as delsym or mucinx OTC for cough  You may also take acetaminophen (Tylenol) as needed for fever.   Reduce your risk of any infection by using the same precautions used for avoiding the common cold or flu:  . Wash your hands often with soap and warm water for at least  20 seconds.  If soap and water are not readily available, use an alcohol-based hand sanitizer with at least 60% alcohol.  . If coughing or sneezing, cover your mouth and nose by coughing or sneezing into the elbow areas of your shirt or coat, into a tissue or into your sleeve (not your hands). . Avoid shaking hands with others and consider head nods or verbal greetings only. . Avoid touching your eyes, nose, or mouth with unwashed hands.  . Avoid close contact with people who are sick. . Avoid places or events with large numbers of people in one location, like concerts or sporting events. . Carefully consider travel plans you have or are making. . If you are planning any travel outside or inside the [place], visit the CDC's Travelers' Health webpage for the latest health notices. . If you have some symptoms but not all symptoms, continue to monitor at home and seek medical attention if your symptoms worsen. . If you are having a medical emergency, call 911.  HOME CARE . Only take medications as instructed by your medical team. . Drink plenty of fluids and get plenty of rest. . A steam or ultrasonic humidifier can help if you have congestion.   GET HELP RIGHT AWAY IF: . You develop worsening fever. . You become short of breath . You cough up blood. . Your symptoms become more severe MAKE SURE YOU   Understand these   instructions.  Will watch your condition.  Will get help right away if you are not doing well or get worse.  Your e-visit answers were reviewed by a board certified advanced clinical practitioner to complete your personal care plan.  Depending on the condition, your plan could have included both over the counter or prescription medications.  If there is a problem please reply once you have received a response from your provider. Your safety is important to us.  If you have drug allergies check your prescription carefully.    You can use MyChart to ask questions about today's  visit, request a non-urgent call back, or ask for a work or school excuse for 24 hours related to this e-Visit. If it has been greater than 24 hours you will need to follow up with your provider, or enter a new e-Visit to address those concerns. You will get an e-mail in the next two days asking about your experience.  I hope that your e-visit has been valuable and will speed your recovery. Thank you for using e-visits.  5 minutes spent reviewing and documenting in chart.   

## 2018-07-23 ENCOUNTER — Encounter: Payer: Self-pay | Admitting: Family

## 2018-07-23 NOTE — Telephone Encounter (Signed)
Provider response was documented in phone note from yesterday.

## 2018-07-23 NOTE — Telephone Encounter (Signed)
Continued- afebrile currently. Advised pt that we are not testing in the outpatient setting.  Advised her to self quarantine as well as her fiance.  She is advised to proceed to the ED if symptoms worsen/increasing sob.  Pt verbalizes understanding.

## 2018-07-23 NOTE — Telephone Encounter (Signed)
Started Friday then got progressively worse through the weekend.   Fiance- works in home healthy.    Decreased tightness across the chest. SOB is about the same.

## 2018-07-25 ENCOUNTER — Encounter: Payer: Self-pay | Admitting: Family

## 2018-07-30 ENCOUNTER — Telehealth: Payer: Self-pay | Admitting: Family

## 2018-07-30 NOTE — Telephone Encounter (Signed)
See my chart message

## 2018-07-31 ENCOUNTER — Telehealth: Payer: Self-pay | Admitting: Family

## 2018-07-31 NOTE — Telephone Encounter (Signed)
Lm for patient to call for follow up appointment.

## 2018-07-31 NOTE — Telephone Encounter (Signed)
Please contact pt and let her know that I would like to schedule her a virtual visit tomorrow to assess her readiness to return to work in the setting of Covid-19 diagnosis.

## 2018-08-01 NOTE — Telephone Encounter (Signed)
Lm for patient to call for appointment today

## 2018-08-03 NOTE — Telephone Encounter (Signed)
Pt called stating she was returning a call to [place]. Please advise.

## 2018-08-06 ENCOUNTER — Encounter: Payer: Self-pay | Admitting: Family

## 2018-08-06 ENCOUNTER — Ambulatory Visit (INDEPENDENT_AMBULATORY_CARE_PROVIDER_SITE_OTHER): Payer: PRIVATE HEALTH INSURANCE | Admitting: Family

## 2018-08-06 ENCOUNTER — Other Ambulatory Visit: Payer: Self-pay

## 2018-08-06 NOTE — Telephone Encounter (Signed)
Talked to patient, she is back to work today and may not need letter. She will contact us for virtual visit if she needs anything else.

## 2018-08-06 NOTE — Progress Notes (Signed)
Virtual Visit via Video Note  I connected with Whitney King on 08/06/18 at  2:20 PM EDT by a video enabled telemedicine application and verified that I am speaking with the correct person using two identifiers. This visit type was conducted due to national recommendations for restrictions regarding the COVID-19 Pandemic (e.g. social distancing).  This format is felt to be most appropriate for this patient at this time.   I discussed the limitations of evaluation and management by telemedicine and the availability of in person appointments. The patient expressed understanding and agreed to proceed.  Only the patient and myself were on today's video visit. The patient was at home and I was in my office at the time of today's visit.   History of Present Illness:  Patient is a 48 yr old female who presents for follow up and release back to work following test confirmed COVID-19 infection.  Patient reports that symptoms began on 3/29, was tested on 3/30. Confirmed positive 10 days after that.  Reports presented with body aches, headache, fatigue.  Then she developed a temp (max 101.4). She then developed cough, shortness of breath and chest tightness. Slept a lot. Reports cough is resolved.  No fever since 3/29 energy is improved. Wants to return to work today.     Observations/Objective:   Gen: Awake, alert, no acute distress Resp: Breathing is even and non-labored Psych: calm/pleasant demeanor Neuro: Alert and Oriented x 3, + facial symmetry, speech is clear.   Assessment and Plan:  Covid-19 infection- clinically resolved. It has been >14 days since the onset of symptoms. Advised pt OK to return to work and a release note has been written. Follow Up Instructions:    I discussed the assessment and treatment plan with the patient. The patient was provided an opportunity to ask questions and all were answered. The patient agreed with the plan and demonstrated an understanding of the  instructions.   The patient was advised to call back or seek an in-person evaluation if the symptoms worsen or if the condition fails to improve as anticipated.    Whitney Pear, NP

## 2018-08-06 NOTE — Telephone Encounter (Signed)
Appointment scheduled for patient

## 2018-08-06 NOTE — Telephone Encounter (Signed)
Patient calling and states that she is needing a letter to return to work. States that a virtual visit is necessary. Please advise.

## 2018-08-10 ENCOUNTER — Ambulatory Visit (INDEPENDENT_AMBULATORY_CARE_PROVIDER_SITE_OTHER): Payer: PRIVATE HEALTH INSURANCE | Admitting: Psychology

## 2018-08-10 DIAGNOSIS — F4321 Adjustment disorder with depressed mood: Secondary | ICD-10-CM | POA: Diagnosis not present

## 2018-08-24 ENCOUNTER — Other Ambulatory Visit: Payer: Self-pay | Admitting: Family

## 2018-09-07 ENCOUNTER — Ambulatory Visit (INDEPENDENT_AMBULATORY_CARE_PROVIDER_SITE_OTHER): Payer: PRIVATE HEALTH INSURANCE | Admitting: Psychology

## 2018-09-07 DIAGNOSIS — F4321 Adjustment disorder with depressed mood: Secondary | ICD-10-CM | POA: Diagnosis not present

## 2018-10-05 ENCOUNTER — Ambulatory Visit (INDEPENDENT_AMBULATORY_CARE_PROVIDER_SITE_OTHER): Payer: PRIVATE HEALTH INSURANCE | Admitting: Psychology

## 2018-10-05 DIAGNOSIS — F4321 Adjustment disorder with depressed mood: Secondary | ICD-10-CM | POA: Diagnosis not present

## 2018-10-09 ENCOUNTER — Encounter: Payer: Self-pay | Admitting: Family

## 2018-10-09 MED ORDER — ALBUTEROL SULFATE HFA 108 (90 BASE) MCG/ACT IN AERS: 2.0000 | INHALATION_SPRAY | Freq: Four times a day (QID) | RESPIRATORY_TRACT | 0 refills | Status: DC | PRN

## 2018-11-02 ENCOUNTER — Ambulatory Visit: Payer: PRIVATE HEALTH INSURANCE | Admitting: Psychology

## 2018-11-07 ENCOUNTER — Ambulatory Visit: Payer: Self-pay | Admitting: Family

## 2018-11-30 NOTE — Progress Notes (Signed)
Cardiology Office Note   Date:  12/03/2018   ID:  Whitney King, DOB [birth year], MRN [id number]  PCP:  Debbrah Alar, NP    No chief complaint on file.  Abnormal ECG  Wt Readings from Last 3 Encounters:  12/03/18 173 lb 12.8 oz (78.8 kg)  05/09/18 178 lb (80.7 kg)  01/08/18 179 lb (81.2 kg)       History of Present Illness: Whitney King is a 48 y.o. female who is being seen today for the evaluation of abnormal ECG at the request of Debbrah Alar, NP.  He works at [employer].  She had an ECG done in January 2020.  This showed normal sinus rhythm with inferior Q waves.  There was a question of an old inferior MI.  There were concerns about ST changes which were different from a 2017 ECG  She had COVID in March.  She had a fever and cough.  SHe uses some inhalers on occasion. She is not exercising as much.    Echo was recommended in January, but she deferred.    Family history includes MI in Dad; AFib in aunt (maternal). Mother with palpitations.   Denies : Chest pain. Dizziness. Leg edema. Nitroglycerin use. Orthopnea. sustained Palpitations. Paroxysmal nocturnal dyspnea. Syncope.   Occasional palpitations- PVCs can be frequent.  She has been reporting some dyspnea on exertion and some mild chest tightness as well.  There is some improvement with albuterol, but she gets fairly tachycardic with this.  Symptoms appeared after her COVID infection.   Past Medical History:  Diagnosis Date  . ABNORMAL FINDINGS, ELEVATED BP W/O HTN 09/13/2006   Qualifier: Diagnosis of  By: Larose Kells MD, [place] NEC 09/08/2006   Qualifier: Diagnosis of  By: Larose Kells MD, [place] Anxiety   . Anxiety and depression 09/08/2006   Qualifier: Diagnosis of  By: Larose Kells MD, [place] Asthma    exercise induced, allergies, no problems since allergy shots  . Bipolar disorder ([place])   . GERD (gastroesophageal reflux disease)    diet controlled only  . Headache(784.0)    otc  meds prn  . HTN (hypertension) 03/16/2015  . Hypertension    no medications at this time- post pregnancy  . Hypothyroidism 05/18/2016  . IBS (irritable bowel syndrome)   . Mental disorder   . Migraines   . OTHER&UNSPECIFIED DISEASES THE ORAL SOFT TISSUES 03/24/2009   Qualifier: Diagnosis of  By: Linna Darner MD, Gwyndolyn Saxon    . PONV (postoperative nausea and vomiting)   . Positive ANA (antinuclear antibody) 07/26/2017  . Seasonal allergies   . SVD (spontaneous vaginal delivery)    x 1    Past Surgical History:  Procedure Laterality Date  . COLONOSCOPY W/ POLYPECTOMY    . [place] N/A 10/17/2012   Procedure: DILATATION & CURETTAGE/HYSTEROSCOPY WITH NOVASURE ABLATION;  Surgeon: Daria Pastures, MD;  Location: [facility];  Service: Gynecology;  Laterality: N/A;  . EYE SURGERY     PRK - bilateral eyes  . LAPAROSCOPIC TUBAL LIGATION Bilateral 07/15/2015   Procedure: LAPAROSCOPIC TUBAL LIGATION;  Surgeon: Bobbye Charleston, MD;  Location: [facility];  Service: Gynecology;  Laterality: Bilateral;  FILSHIE CLIPS  . left leg vein surgery Bilateral    Laser  . left wrist surgery  2012   fracture repair  . LUMBAR LAMINECTOMY/DECOMPRESSION MICRODISCECTOMY N/A 10/28/2015   Procedure: LUMBAR 4-5 DECOMPRESSION ;  Surgeon: Phylliss Bob, MD;  Location: [facility];  Service: Orthopedics;  Laterality: N/A;  LUMBAR 4-5 DECOMPRESSION   . RECTOCELE REPAIR N/A 10/17/2012   Procedure: POSTERIOR REPAIR (RECTOCELE);  Surgeon: Daria Pastures, MD;  Location: [facility];  Service: Gynecology;  Laterality: N/A;  . RIGHT ANKLE SURGERY  1996   RECONSTRUCTION  . WISDOM TOOTH EXTRACTION       Current Outpatient Medications  Medication Sig Dispense Refill  . albuterol (VENTOLIN HFA) 108 (90 Base) MCG/ACT inhaler Inhale 2 puffs into the lungs every 6 (six) hours as needed for wheezing or shortness of breath. 1 Inhaler 0  . clonazePAM (KLONOPIN) 0.5 MG tablet TAKE 1 TABLET BY MOUTH THREE  TIMES DAILY AS NEEDED FOR ANXIETY 30 tablet 0  . estradiol (ESTRACE) 0.5 MG tablet TK 1 T PO QD  4  . hydrochlorothiazide (HYDRODIURIL) 25 MG tablet TAKE 1 TABLET(25 MG) BY MOUTH DAILY 90 tablet 1  . levothyroxine (SYNTHROID) 50 MCG tablet TAKE 1 TABLET(50 MCG) BY MOUTH DAILY BEFORE BREAKFAST 90 tablet 1  . lisinopril (PRINIVIL,ZESTRIL) 20 MG tablet TAKE 1 TABLET(20 MG) BY MOUTH DAILY 90 tablet 1  . rizatriptan (MAXALT) 10 MG tablet Take 1 tablet (10 mg total) by mouth as needed for migraine. May repeat in 2 hours if needed 10 tablet 5  . valACYclovir (VALTREX) 1000 MG tablet 2 tabs by mouth at start of cold sore then 2 tabs 12 hours later 20 tablet 1  . venlafaxine XR (EFFEXOR-XR) 150 MG 24 hr capsule TAKE 1 CAPSULE(150 MG) BY MOUTH DAILY WITH BREAKFAST 90 capsule 1   No current facility-administered medications for this visit.     Allergies:   Erythromycin    Social History:  The patient  reports that she quit smoking about 3 years ago. Her smoking use included cigarettes. She has a 1.00 pack-year smoking history. She has never used smokeless tobacco. She reports current alcohol use of about 2.0 standard drinks of alcohol per week. She reports that she does not use drugs.   Family History:  The patient's family history includes Colon cancer in her father and mother; Heart disease in her father; Hyperlipidemia in her mother; Hypertension in her father, maternal grandmother, and mother; Stroke in her paternal grandfather.    ROS:  Please see the history of present illness.   Otherwise, review of systems are positive for DOE, chest tightness.   All other systems are reviewed and negative.    PHYSICAL EXAM: VS:  BP 118/82   Pulse 69   Ht 5\' 6"  (1.676 m)   Wt 173 lb 12.8 oz (78.8 kg)   SpO2 92%   BMI 28.05 kg/m  , BMI Body mass index is 28.05 kg/m. GEN: Well nourished, well developed, in no acute distress  HEENT: normal  Neck: no JVD, carotid bruits, or masses Cardiac: RRR; no  murmurs, rubs, or gallops,no edema  Respiratory:  clear to auscultation bilaterally, normal work of breathing GI: soft, nontender, nondistended, + BS MS: no deformity or atrophy  Skin: warm and dry, no rash Neuro:  Strength and sensation are intact Psych: euthymic mood, full affect   EKG:   The ekg ordered today demonstrates normal sinus rhythm, nonspecific ST changes, when compared to her January 2020 ECG, inferior Q waves are no longer present   Recent Labs: 05/09/2018: ALT 13; BUN 17; Creatinine, Ser 0.81; Hemoglobin 13.9; Platelets 208.0; Potassium 4.0; Sodium 137; TSH 1.92   Lipid Panel    Component Value Date/Time   CHOL 221 (H) 05/09/2018  0800   TRIG 88.0 05/09/2018 0800   HDL 79.20 05/09/2018 0800   CHOLHDL 3 05/09/2018 0800   VLDL 17.6 05/09/2018 0800   LDLCALC 124 (H) 05/09/2018 0800     Other studies Reviewed: Additional studies/ records that were reviewed today with results demonstrating: ECG reviewed.   ASSESSMENT AND PLAN:  1. Abnormal ECG: Inferior Q waves.  We discussed echocardiogram, but there are no abnormal physical exam findings.  Given the shortness of breath and chest tightness, will plan for coronary CT.  She does have family history of early heart disease as well in her father.  Will give a dose of metoprolol to help keep her heart rate slow. 2. HTN:  The current medical regimen is effective;  continue present plan and medications.  Developed with pregnancy. 3. Lipids show very high HDL.  This increases the total.  Would not treat her with lipid-lowering therapy unless her coronary CT showed atherosclerosis.   Current medicines are reviewed at length with the patient today.  The patient concerns regarding her medicines were addressed.  The following changes have been made:  No change  Labs/ tests ordered today include:  No orders of the defined types were placed in this encounter.   Recommend 150 minutes/week of aerobic exercise Low fat, low  carb, high fiber diet recommended  Disposition:   FU in for coronary CT   Signed, Larae Grooms, MD  12/03/2018 9:25 AM    [address], [place], [place]  [address] Phone: [phone]; Fax: [phone]

## 2018-12-03 ENCOUNTER — Encounter: Payer: Self-pay | Admitting: Interventional Cardiology

## 2018-12-03 ENCOUNTER — Ambulatory Visit: Payer: PRIVATE HEALTH INSURANCE | Admitting: Interventional Cardiology

## 2018-12-03 ENCOUNTER — Other Ambulatory Visit: Payer: Self-pay

## 2018-12-03 VITALS — BP 118/82 | HR 69 | Ht 66.0 in | Wt 173.8 lb

## 2018-12-03 DIAGNOSIS — R9431 Abnormal electrocardiogram [ECG] [EKG]: Secondary | ICD-10-CM | POA: Diagnosis not present

## 2018-12-03 DIAGNOSIS — R0602 Shortness of breath: Secondary | ICD-10-CM | POA: Diagnosis not present

## 2018-12-03 DIAGNOSIS — R072 Precordial pain: Secondary | ICD-10-CM | POA: Diagnosis not present

## 2018-12-03 LAB — BASIC METABOLIC PANEL
BUN/Creatinine Ratio: 11 (ref 9–23)
BUN: 8 mg/dL (ref 6–24)
CO2: 23 mmol/L (ref 20–29)
Calcium: 9.6 mg/dL (ref 8.7–10.2)
Chloride: 104 mmol/L (ref 96–106)
Creatinine, Ser: 0.75 mg/dL (ref 0.57–1.00)
GFR calc Af Amer: 109 mL/min/{1.73_m2} (ref >59)
GFR calc non Af Amer: 95 mL/min/{1.73_m2} (ref >59)
Glucose: 97 mg/dL (ref 65–99)
Potassium: 4.5 mmol/L (ref 3.5–5.2)
Sodium: 140 mmol/L (ref 134–144)

## 2018-12-03 MED ORDER — METOPROLOL TARTRATE 25 MG PO TABS: ORAL_TABLET | ORAL | 0 refills | Status: DC

## 2018-12-03 NOTE — Patient Instructions (Addendum)
Medication Instructions:  Your physician recommends that you continue on your current medications as directed. Please refer to the Current Medication list given to you today.  A prescription for metoprolol tartrate (lopressor) 25 mg tablet has been sent to your pharmacy. Only take 1 hour prior to Cardiac CT  If you need a refill on your cardiac medications before your next appointment, please call your pharmacy.   Lab work: TODAY: BMET  If you have labs (blood work) drawn today and your tests are completely normal, you will receive your results only by: Marland Kitchen MyChart Message (if you have MyChart) OR . A paper copy in the mail If you have any lab test that is abnormal or we need to change your treatment, we will call you to review the results.  Testing/Procedures: Your physician has requested that you have cardiac CT. Cardiac computed tomography (CT) is a painless test that uses an x-ray machine to take clear, detailed pictures of your heart. For further information please visit [email or web address]. Please follow instruction sheet as given.  Follow-Up: . Based on test results  Any Other Special Instructions Will Be Listed Below (If Applicable).  CARDIAC CT INSTRUCTIONS  Your cardiac CT will be scheduled at one of the below locations:   [facility] [address] [place], [place] [address] (336) [place] [address] [place], [place] [address] [phone]  Please arrive at the [facility] main entrance of [facility] 30-45 minutes prior to test start time. Proceed to the [facility] Radiology Department (first floor) to check-in and test prep.  Please follow these instructions carefully (unless otherwise directed):  On the Night Before the Test: . Be sure to Drink plenty of water. . Do not consume any caffeinated/decaffeinated beverages or chocolate 12 hours prior to your test. . Do not take  any antihistamines 12 hours prior to your test.   On the Day of the Test: . Drink plenty of water. Do not drink any water within one hour of the test. . Do not eat any food 4 hours prior to the test. . You may take your regular medications prior to the test.  . Take metoprolol (Lopressor) two hours prior to test. . HOLD Hydrochlorothiazide morning of the test. . FEMALES- please wear underwire-free bra if available     After the Test: . Drink plenty of water. . After receiving IV contrast, you may experience a mild flushed feeling. This is normal. . On occasion, you may experience a mild rash up to 24 hours after the test. This is not dangerous. If this occurs, you can take Benadryl 25 mg and increase your fluid intake. . If you experience trouble breathing, this can be serious. If it is severe call 911 IMMEDIATELY. If it is mild, please call our office. .   Please contact the cardiac imaging nurse navigator should you have any questions/concerns Marchia Bond, RN Navigator Cardiac Imaging [facility] [phone] Office [phone] Cell

## 2018-12-21 NOTE — Telephone Encounter (Signed)
Patient has been scheduled for Cardiac CT on 9/2

## 2018-12-26 ENCOUNTER — Other Ambulatory Visit: Payer: Self-pay

## 2018-12-26 ENCOUNTER — Ambulatory Visit (HOSPITAL_COMMUNITY)
Admission: RE | Admit: 2018-12-26 | Discharge: 2018-12-26 | Disposition: A | Discharge status: Home / Self Care | Payer: PRIVATE HEALTH INSURANCE | Source: Ambulatory Visit | Attending: Interventional Cardiology | Admitting: Interventional Cardiology

## 2018-12-26 ENCOUNTER — Ambulatory Visit (HOSPITAL_COMMUNITY): Admission: RE | Admit: 2018-12-26 | Payer: PRIVATE HEALTH INSURANCE | Source: Ambulatory Visit

## 2018-12-26 DIAGNOSIS — R0602 Shortness of breath: Secondary | ICD-10-CM | POA: Diagnosis present

## 2018-12-26 DIAGNOSIS — R072 Precordial pain: Secondary | ICD-10-CM | POA: Diagnosis present

## 2018-12-26 DIAGNOSIS — R9431 Abnormal electrocardiogram [ECG] [EKG]: Secondary | ICD-10-CM

## 2018-12-26 MED ORDER — NITROGLYCERIN 0.4 MG SL SUBL
SUBLINGUAL_TABLET | SUBLINGUAL | Status: AC
  Filled 2018-12-26: qty 2

## 2018-12-26 MED ORDER — METOPROLOL TARTRATE 5 MG/5ML IV SOLN
INTRAVENOUS | Status: AC
  Filled 2018-12-26: qty 15

## 2018-12-26 MED ORDER — NITROGLYCERIN 0.4 MG SL SUBL
0.8000 mg | SUBLINGUAL_TABLET | Freq: Once | SUBLINGUAL | Status: AC
  Administered 2018-12-26: 16:00:00 0.8 mg via SUBLINGUAL
  Filled 2018-12-26: qty 25

## 2018-12-26 MED ORDER — METOPROLOL TARTRATE 5 MG/5ML IV SOLN
5.0000 mg | INTRAVENOUS | Status: DC | PRN
  Administered 2018-12-26: 5 mg via INTRAVENOUS
  Filled 2018-12-26 (×2): qty 5

## 2018-12-26 MED ORDER — IOHEXOL 350 MG/ML SOLN
100.0000 mL | Freq: Once | INTRAVENOUS | Status: AC | PRN
  Administered 2018-12-26: 100 mL via INTRAVENOUS

## 2018-12-26 NOTE — Progress Notes (Signed)
CT scan completed. Tolerated well. D/C home walking. Awake and alert. In no distress. 

## 2019-01-10 ENCOUNTER — Other Ambulatory Visit: Payer: Self-pay | Admitting: *Deleted

## 2019-01-10 MED ORDER — VENLAFAXINE HCL ER 150 MG PO CP24: ORAL_CAPSULE | ORAL | 0 refills | Status: DC

## 2019-01-10 MED ORDER — HYDROCHLOROTHIAZIDE 25 MG PO TABS: ORAL_TABLET | ORAL | 0 refills | Status: DC

## 2019-01-10 MED ORDER — LISINOPRIL 20 MG PO TABS: ORAL_TABLET | ORAL | 0 refills | Status: DC

## 2019-01-15 ENCOUNTER — Ambulatory Visit (INDEPENDENT_AMBULATORY_CARE_PROVIDER_SITE_OTHER): Payer: PRIVATE HEALTH INSURANCE | Admitting: Psychology

## 2019-01-15 DIAGNOSIS — F4321 Adjustment disorder with depressed mood: Secondary | ICD-10-CM

## 2019-02-28 ENCOUNTER — Encounter: Payer: Self-pay | Admitting: Family

## 2019-03-01 MED ORDER — FLUCONAZOLE 150 MG PO TABS: ORAL_TABLET | ORAL | 0 refills | Status: DC

## 2019-03-01 NOTE — Telephone Encounter (Signed)
Please advise pt that I sent rx for diflucan to her pharmacy. She will need OV if symptoms fail to improve.

## 2019-03-20 ENCOUNTER — Telehealth: Payer: Self-pay | Admitting: Family

## 2019-03-20 ENCOUNTER — Other Ambulatory Visit: Payer: Self-pay

## 2019-03-20 MED ORDER — LEVOTHYROXINE SODIUM 50 MCG PO TABS: ORAL_TABLET | ORAL | 0 refills | Status: DC

## 2019-03-20 NOTE — Telephone Encounter (Signed)
Patient requesting levothyroxine (SYNTHROID) 50 MCG tablet , patient scheduled for medication follow up for 03/27/2019 and will run out prior, requesting a short supply.   [place], [place]

## 2019-03-27 ENCOUNTER — Ambulatory Visit (INDEPENDENT_AMBULATORY_CARE_PROVIDER_SITE_OTHER): Payer: PRIVATE HEALTH INSURANCE | Admitting: Family

## 2019-03-27 ENCOUNTER — Other Ambulatory Visit: Payer: Self-pay

## 2019-03-27 ENCOUNTER — Encounter: Payer: Self-pay | Admitting: Family

## 2019-03-27 DIAGNOSIS — F419 Anxiety disorder, unspecified: Secondary | ICD-10-CM

## 2019-03-27 DIAGNOSIS — I10 Essential (primary) hypertension: Secondary | ICD-10-CM | POA: Diagnosis not present

## 2019-03-27 DIAGNOSIS — G43809 Other migraine, not intractable, without status migrainosus: Secondary | ICD-10-CM

## 2019-03-27 DIAGNOSIS — F329 Major depressive disorder, single episode, unspecified: Secondary | ICD-10-CM

## 2019-03-27 DIAGNOSIS — E039 Hypothyroidism, unspecified: Secondary | ICD-10-CM

## 2019-03-27 DIAGNOSIS — F32A Depression, unspecified: Secondary | ICD-10-CM

## 2019-03-27 NOTE — Progress Notes (Signed)
Virtual Visit via Video Note  I connected with Whitney King on 03/27/19 at  9:20 AM EST by a video enabled telemedicine application and verified that I am speaking with the correct person using two identifiers.  Location: Patient: home Provider: work   I discussed the limitations of evaluation and management by telemedicine and the availability of in person appointments. The patient expressed understanding and agreed to proceed.  History of Present Illness:  HTN- 119/76 1 month ago.  Blood pressure medication includes hydrochlorothiazide, lisinopril, and metoprolol. BP Readings from Last 3 Encounters:  12/26/18 129/86  12/03/18 118/82  05/09/18 125/84   Hypothyroid-she is maintained on Synthroid 50 mcg once daily.  Reports feeling well on current dose. Lab Results  Component Value Date   TSH 1.92 05/09/2018   Reports that progesterone/estrogen addition by her gynecologist has really helped with her overall symptoms and mood.   Anxiety- reports mood is stable on effexor.    Migraines- reports that she had one on Saturday evening.  Uses Maxalt as needed.  Past Medical History:  Diagnosis Date  . ABNORMAL FINDINGS, ELEVATED BP W/O HTN 09/13/2006   Qualifier: Diagnosis of  By: Larose Kells MD, [place] NEC 09/08/2006   Qualifier: Diagnosis of  By: Larose Kells MD, [place] Anxiety   . Anxiety and depression 09/08/2006   Qualifier: Diagnosis of  By: Larose Kells MD, [place] Asthma    exercise induced, allergies, no problems since allergy shots  . Bipolar disorder ([place])   . GERD (gastroesophageal reflux disease)    diet controlled only  . Headache(784.0)    otc meds prn  . HTN (hypertension) 03/16/2015  . Hypertension    no medications at this time- post pregnancy  . Hypothyroidism 05/18/2016  . IBS (irritable bowel syndrome)   . Mental disorder   . Migraines   . OTHER&UNSPECIFIED DISEASES THE ORAL SOFT TISSUES 03/24/2009   Qualifier: Diagnosis of  By: Linna Darner MD, Gwyndolyn Saxon    . PONV  (postoperative nausea and vomiting)   . Positive ANA (antinuclear antibody) 07/26/2017  . Seasonal allergies   . SVD (spontaneous vaginal delivery)    x 1     Social History   Socioeconomic History  . Marital status: Legally Separated    Spouse name: Not on file  . Number of children: 2  . Years of education: Not on file  . Highest education level: Not on file  Occupational History  . 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 Financial resource strain: Not on file  . Food insecurity    Worry: Not on file    Inability: Not on file  . Transportation needs    Medical: Not on file    Non-medical: Not on file  Tobacco Use  . Smoking status: Former Smoker    Packs/day: 0.05    Years: 20.00    Pack years: 1.00    Types: Cigarettes    Quit date: 02/10/2015    Years since quitting: 4.1  . Smokeless tobacco: Never Used  Substance and Sexual Activity  . Alcohol use: Yes    Alcohol/week: 2.0 standard drinks    Types: 2 Glasses of wine per week    Comment: socially  . Drug use: No  . Sexual activity: Yes    Birth control/protection: None  Lifestyle  . Physical activity    Days per week: Not on file    Minutes per  session: Not on file  . Stress: Not on file  Relationships  . Social Herbalist on phone: Not on file    Gets together: Not on file    Attends religious service: Not on file    Active member of club or organization: Not on file    Attends meetings of clubs or organizations: Not on file    Relationship status: Not on file  . Intimate partner violence    Fear of current or ex partner: Not on file    Emotionally abused: Not on file    Physically abused: Not on file    Forced sexual activity: Not on file  Other Topics Concern  . Not on file  Social History Narrative   Separated   2 children   Works as an Engineer, structural PA   Enjoys gym, kayak, paddle, boarding, outside activities    Past Surgical History:  Procedure  Laterality Date  . COLONOSCOPY W/ POLYPECTOMY    . [place] N/A 10/17/2012   Procedure: DILATATION & CURETTAGE/HYSTEROSCOPY WITH NOVASURE ABLATION;  Surgeon: Daria Pastures, MD;  Location: [facility];  Service: Gynecology;  Laterality: N/A;  . EYE SURGERY     PRK - bilateral eyes  . LAPAROSCOPIC TUBAL LIGATION Bilateral 07/15/2015   Procedure: LAPAROSCOPIC TUBAL LIGATION;  Surgeon: Bobbye Charleston, MD;  Location: [facility];  Service: Gynecology;  Laterality: Bilateral;  FILSHIE CLIPS  . left leg vein surgery Bilateral    Laser  . left wrist surgery  2012   fracture repair  . LUMBAR LAMINECTOMY/DECOMPRESSION MICRODISCECTOMY N/A 10/28/2015   Procedure: LUMBAR 4-5 DECOMPRESSION ;  Surgeon: Phylliss Bob, MD;  Location: [facility];  Service: Orthopedics;  Laterality: N/A;  LUMBAR 4-5 DECOMPRESSION   . RECTOCELE REPAIR N/A 10/17/2012   Procedure: POSTERIOR REPAIR (RECTOCELE);  Surgeon: Daria Pastures, MD;  Location: [facility];  Service: Gynecology;  Laterality: N/A;  . RIGHT ANKLE SURGERY  1996   RECONSTRUCTION  . WISDOM TOOTH EXTRACTION      Family History  Problem Relation Age of Onset  . Colon cancer Mother   . Hyperlipidemia Mother   . Hypertension Mother   . Colon cancer Father   . Heart disease Father   . Hypertension Father   . Stroke Paternal Grandfather   . Hypertension Maternal Grandmother     Allergies  Allergen Reactions  . Erythromycin Diarrhea    Current Outpatient Medications on File Prior to Visit  Medication Sig Dispense Refill  . albuterol (VENTOLIN HFA) 108 (90 Base) MCG/ACT inhaler Inhale 2 puffs into the lungs every 6 (six) hours as needed for wheezing or shortness of breath. 1 Inhaler 0  . estradiol (ESTRACE) 0.5 MG tablet TK 1 T PO QD  4  . hydrochlorothiazide (HYDRODIURIL) 25 MG tablet TAKE 1 TABLET(25 MG) BY MOUTH DAILY.  Needs ov 90 tablet 0  . levothyroxine (SYNTHROID) 50 MCG tablet TAKE 1 TABLET(50 MCG) BY MOUTH  DAILY BEFORE BREAKFAST 30 tablet 0  . lisinopril (ZESTRIL) 20 MG tablet TAKE 1 TABLET(20 MG) BY MOUTH DAILY.  Needs ov 90 tablet 0  . metoprolol tartrate (LOPRESSOR) 25 MG tablet Take 1 tablet by mouth 2 hours prior to Cardiac CT 1 tablet 0  . rizatriptan (MAXALT) 10 MG tablet Take 1 tablet (10 mg total) by mouth as needed for migraine. May repeat in 2 hours if needed 10 tablet 5  . valACYclovir (VALTREX) 1000 MG tablet 2 tabs by mouth  at start of cold sore then 2 tabs 12 hours later 20 tablet 1  . venlafaxine XR (EFFEXOR-XR) 150 MG 24 hr capsule TAKE 1 CAPSULE(150 MG) BY MOUTH DAILY WITH BREAKFAST.  Needs ov 90 capsule 0   No current facility-administered medications on file prior to visit.     There were no vitals taken for this visit.     Observations/Objective:   Gen: Awake, alert, no acute distress Resp: Breathing is even and non-labored Psych: calm/pleasant demeanor Neuro: Alert and Oriented x 3, + facial symmetry, speech is clear.   Assessment and Plan: Hypertension-blood pressure is stable.  Continue current meds.  Will obtain follow-up basic metabolic panel.  Hypothyroid-clinically stable.  Will obtain follow-up TSH.  Depression/anxiety-mood stable on Effexor XR.  Continue same.  Migraines-stable with as needed use of Maxalt.    Follow Up Instructions:    I discussed the assessment and treatment plan with the patient. The patient was provided an opportunity to ask questions and all were answered. The patient agreed with the plan and demonstrated an understanding of the instructions.   The patient was advised to call back or seek an in-person evaluation if the symptoms worsen or if the condition fails to improve as anticipated.  Nance Pear, NP

## 2019-04-08 ENCOUNTER — Other Ambulatory Visit (INDEPENDENT_AMBULATORY_CARE_PROVIDER_SITE_OTHER): Payer: PRIVATE HEALTH INSURANCE

## 2019-04-08 DIAGNOSIS — E039 Hypothyroidism, unspecified: Secondary | ICD-10-CM

## 2019-04-08 DIAGNOSIS — I10 Essential (primary) hypertension: Secondary | ICD-10-CM

## 2019-04-08 LAB — BASIC METABOLIC PANEL
BUN: 12 mg/dL (ref 6–23)
CO2: 27 mEq/L (ref 19–32)
Calcium: 9.4 mg/dL (ref 8.4–10.5)
Chloride: 102 mEq/L (ref 96–112)
Creatinine, Ser: 0.82 mg/dL (ref 0.40–1.20)
GFR: 74.07 mL/min (ref >60.00)
Glucose, Bld: 123 mg/dL — ABNORMAL HIGH (ref 70–99)
Potassium: 3.4 mEq/L — ABNORMAL LOW (ref 3.5–5.1)
Sodium: 137 mEq/L (ref 135–145)

## 2019-04-08 LAB — TSH: TSH: 1.65 u[IU]/mL (ref 0.35–4.50)

## 2019-04-09 ENCOUNTER — Telehealth: Payer: Self-pay | Admitting: Family

## 2019-04-09 DIAGNOSIS — E876 Hypokalemia: Secondary | ICD-10-CM

## 2019-04-09 DIAGNOSIS — R739 Hyperglycemia, unspecified: Secondary | ICD-10-CM

## 2019-04-09 MED ORDER — POTASSIUM CHLORIDE CRYS ER 20 MEQ PO TBCR: 20.0000 meq | EXTENDED_RELEASE_TABLET | Freq: Every day | ORAL | 3 refills | Status: DC

## 2019-04-09 NOTE — Telephone Encounter (Signed)
Please contact pt and let her know that her potassium is low. This is likely due to HCTZ. I would like her to add kdur 4meq once daily and repeat bmet in 1 week.    Also, sugar was mildly elevated. I don't believe she was fasting. I will add an A1C onto her next blood draw to see how high her sugar is averaging.

## 2019-04-11 ENCOUNTER — Other Ambulatory Visit: Payer: Self-pay

## 2019-04-11 DIAGNOSIS — E876 Hypokalemia: Secondary | ICD-10-CM

## 2019-04-11 NOTE — Telephone Encounter (Signed)
Called but no answer, lvm for patient to contact office back about her results via phone or [place].

## 2019-04-11 NOTE — Telephone Encounter (Signed)
Patient advised of results and plan, she will start medication and repeat bmp at [facility] in one week. Orders entered

## 2019-04-11 NOTE — Telephone Encounter (Signed)
Pt calling back. Please advise.

## 2019-04-12 ENCOUNTER — Other Ambulatory Visit: Payer: Self-pay | Admitting: Family

## 2019-04-15 ENCOUNTER — Encounter: Payer: Self-pay | Admitting: Family

## 2019-04-15 ENCOUNTER — Other Ambulatory Visit: Payer: Self-pay

## 2019-04-15 MED ORDER — VENLAFAXINE HCL ER 150 MG PO CP24: ORAL_CAPSULE | ORAL | 0 refills | Status: DC

## 2019-04-16 ENCOUNTER — Other Ambulatory Visit: Payer: Self-pay

## 2019-04-16 MED ORDER — HYDROCHLOROTHIAZIDE 25 MG PO TABS: ORAL_TABLET | ORAL | 0 refills | Status: DC

## 2019-04-16 MED ORDER — LISINOPRIL 20 MG PO TABS: ORAL_TABLET | ORAL | 0 refills | Status: DC

## 2019-05-07 ENCOUNTER — Telehealth: Payer: Self-pay | Admitting: Family

## 2019-05-07 NOTE — Telephone Encounter (Signed)
See mychart.  

## 2019-05-15 ENCOUNTER — Other Ambulatory Visit (INDEPENDENT_AMBULATORY_CARE_PROVIDER_SITE_OTHER): Payer: PRIVATE HEALTH INSURANCE

## 2019-05-15 DIAGNOSIS — E876 Hypokalemia: Secondary | ICD-10-CM

## 2019-05-15 DIAGNOSIS — R739 Hyperglycemia, unspecified: Secondary | ICD-10-CM

## 2019-05-15 LAB — BASIC METABOLIC PANEL
BUN: 13 mg/dL (ref 6–23)
CO2: 30 mEq/L (ref 19–32)
Calcium: 10 mg/dL (ref 8.4–10.5)
Chloride: 101 mEq/L (ref 96–112)
Creatinine, Ser: 0.77 mg/dL (ref 0.40–1.20)
GFR: 79.61 mL/min (ref >60.00)
Glucose, Bld: 92 mg/dL (ref 70–99)
Potassium: 3.6 mEq/L (ref 3.5–5.1)
Sodium: 137 mEq/L (ref 135–145)

## 2019-05-15 LAB — HEMOGLOBIN A1C: Hgb A1c MFr Bld: 5 % (ref 4.6–6.5)

## 2019-07-10 ENCOUNTER — Encounter: Payer: Self-pay | Admitting: Family

## 2019-07-10 ENCOUNTER — Other Ambulatory Visit: Payer: Self-pay | Admitting: Family

## 2019-07-11 MED ORDER — VENLAFAXINE HCL ER 75 MG PO CP24: 75.0000 mg | ORAL_CAPSULE | Freq: Every day | ORAL | 1 refills | Status: DC

## 2019-07-11 MED ORDER — HYDROCHLOROTHIAZIDE 25 MG PO TABS: ORAL_TABLET | ORAL | 0 refills | Status: DC

## 2019-07-11 MED ORDER — LISINOPRIL 20 MG PO TABS: ORAL_TABLET | ORAL | 0 refills | Status: DC

## 2019-08-13 ENCOUNTER — Encounter: Payer: Self-pay | Admitting: Family

## 2019-08-13 MED ORDER — VENLAFAXINE HCL ER 75 MG PO CP24: 75.0000 mg | ORAL_CAPSULE | Freq: Every day | ORAL | 0 refills | Status: DC

## 2019-08-23 ENCOUNTER — Encounter: Payer: Self-pay | Admitting: Family

## 2019-08-23 MED ORDER — CLONAZEPAM 0.5 MG PO TABS: 0.5000 mg | ORAL_TABLET | Freq: Two times a day (BID) | ORAL | 0 refills | Status: DC | PRN

## 2019-10-01 ENCOUNTER — Ambulatory Visit (INDEPENDENT_AMBULATORY_CARE_PROVIDER_SITE_OTHER): Payer: PRIVATE HEALTH INSURANCE | Admitting: Psychology

## 2019-10-01 DIAGNOSIS — F4321 Adjustment disorder with depressed mood: Secondary | ICD-10-CM

## 2019-10-31 ENCOUNTER — Other Ambulatory Visit: Payer: Self-pay

## 2019-10-31 MED ORDER — HYDROCHLOROTHIAZIDE 25 MG PO TABS: ORAL_TABLET | ORAL | 0 refills | Status: DC

## 2019-11-04 ENCOUNTER — Other Ambulatory Visit: Payer: Self-pay | Admitting: Family

## 2019-11-06 ENCOUNTER — Telehealth: Payer: Self-pay | Admitting: *Deleted

## 2019-11-06 MED ORDER — LISINOPRIL 20 MG PO TABS: ORAL_TABLET | ORAL | 0 refills | Status: DC

## 2019-11-06 NOTE — Telephone Encounter (Signed)
Received fax from [facility] requesting refill of Lisinopril. 90 day supply sent and email sent to pt reminding her she is due for 3 month follow up on or soon after 11/12/19.

## 2019-12-01 ENCOUNTER — Other Ambulatory Visit: Payer: Self-pay | Admitting: Family

## 2019-12-02 NOTE — Telephone Encounter (Signed)
Please contact pt to schedule follow up appointment.  °

## 2019-12-04 NOTE — Telephone Encounter (Signed)
Left voicemail to schedule appointment

## 2019-12-11 MED ORDER — VENLAFAXINE HCL ER 75 MG PO CP24: 75.0000 mg | ORAL_CAPSULE | Freq: Every day | ORAL | 0 refills | Status: DC

## 2019-12-11 NOTE — Addendum Note (Signed)
Addended by: Kelle Darting A on: 12/11/2019 03:18 PM   Modules accepted: Orders

## 2019-12-16 ENCOUNTER — Ambulatory Visit: Payer: PRIVATE HEALTH INSURANCE | Admitting: Family

## 2019-12-16 ENCOUNTER — Other Ambulatory Visit: Payer: Self-pay

## 2019-12-16 VITALS — BP 127/83 | HR 67 | Temp 98.6°F | Resp 16 | Ht 65.7 in | Wt 167.0 lb

## 2019-12-16 DIAGNOSIS — G43809 Other migraine, not intractable, without status migrainosus: Secondary | ICD-10-CM

## 2019-12-16 DIAGNOSIS — E039 Hypothyroidism, unspecified: Secondary | ICD-10-CM | POA: Diagnosis not present

## 2019-12-16 DIAGNOSIS — Z0000 Encounter for general adult medical examination without abnormal findings: Secondary | ICD-10-CM

## 2019-12-16 DIAGNOSIS — F419 Anxiety disorder, unspecified: Secondary | ICD-10-CM | POA: Diagnosis not present

## 2019-12-16 DIAGNOSIS — F32A Depression, unspecified: Secondary | ICD-10-CM

## 2019-12-16 DIAGNOSIS — I10 Essential (primary) hypertension: Secondary | ICD-10-CM | POA: Diagnosis not present

## 2019-12-16 DIAGNOSIS — F329 Major depressive disorder, single episode, unspecified: Secondary | ICD-10-CM

## 2019-12-16 LAB — BASIC METABOLIC PANEL
BUN: 11 mg/dL (ref 6–23)
CO2: 27 mEq/L (ref 19–32)
Calcium: 9.4 mg/dL (ref 8.4–10.5)
Chloride: 102 mEq/L (ref 96–112)
Creatinine, Ser: 0.78 mg/dL (ref 0.40–1.20)
GFR: 78.25 mL/min (ref >60.00)
Glucose, Bld: 91 mg/dL (ref 70–99)
Potassium: 4 mEq/L (ref 3.5–5.1)
Sodium: 138 mEq/L (ref 135–145)

## 2019-12-16 LAB — TSH: TSH: 2.41 u[IU]/mL (ref 0.35–4.50)

## 2019-12-16 MED ORDER — HYDROCHLOROTHIAZIDE 25 MG PO TABS: ORAL_TABLET | ORAL | 1 refills | Status: DC

## 2019-12-16 MED ORDER — LEVOTHYROXINE SODIUM 50 MCG PO TABS: 50.0000 ug | ORAL_TABLET | Freq: Every day | ORAL | 1 refills | Status: DC

## 2019-12-16 MED ORDER — LISINOPRIL 20 MG PO TABS: ORAL_TABLET | ORAL | 1 refills | Status: DC

## 2019-12-16 MED ORDER — POTASSIUM CHLORIDE ER 10 MEQ PO TBCR: 20.0000 meq | EXTENDED_RELEASE_TABLET | Freq: Every day | ORAL | 1 refills | Status: DC

## 2019-12-16 MED ORDER — RIZATRIPTAN BENZOATE 10 MG PO TABS: 10.0000 mg | ORAL_TABLET | ORAL | 5 refills | Status: DC | PRN

## 2019-12-16 MED ORDER — VENLAFAXINE HCL ER 75 MG PO CP24: 75.0000 mg | ORAL_CAPSULE | Freq: Every day | ORAL | 1 refills | Status: DC

## 2019-12-16 MED ORDER — VALACYCLOVIR HCL 1 G PO TABS: ORAL_TABLET | ORAL | 1 refills | Status: DC

## 2019-12-16 NOTE — Patient Instructions (Signed)
Please complete lab work prior to leaving.   

## 2019-12-16 NOTE — Progress Notes (Signed)
Subjective:    Patient ID: Whitney King, female    DOB: September 30, 1970, 49 y.o.   MRN: [id number]  HPI  HTN- maintained on hctz and lisinopril. BP Readings from Last 3 Encounters:  12/16/19 127/83  12/26/18 129/86  12/03/18 118/82   Hypothyroid- continues synthroid. Lab Results  Component Value Date   TSH 1.65 04/08/2019   Migraines- reports no recent migraines.  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  Depression/anxiety- stable on effexor with rare use of clonazepam.     Review of Systems See HPI  Past Medical History:  Diagnosis Date  . ABNORMAL FINDINGS, ELEVATED BP W/O HTN 09/13/2006   Qualifier: Diagnosis of  By: Larose Kells MD, [place] NEC 09/08/2006   Qualifier: Diagnosis of  By: Larose Kells MD, [place] Anxiety   . Anxiety and depression 09/08/2006   Qualifier: Diagnosis of  By: Larose Kells MD, [place] Asthma    exercise induced, allergies, no problems since allergy shots  . Bipolar disorder ([place])   . GERD (gastroesophageal reflux disease)    diet controlled only  . Headache(784.0)    otc meds prn  . HTN (hypertension) 03/16/2015  . Hypertension    no medications at this time- post pregnancy  . Hypothyroidism 05/18/2016  . IBS (irritable bowel syndrome)   . Mental disorder   . Migraines   . OTHER&UNSPECIFIED DISEASES THE ORAL SOFT TISSUES 03/24/2009   Qualifier: Diagnosis of  By: Linna Darner MD, [place]wyndolyn Sax[place]    . PONV (postoperative nausea and vomiting)   . Positive ANA (antinuclear antibody) 07/26/2017  . Seasonal allergies   . SVD (spontaneous vaginal delivery)    x 1     Social History   Socioeconomic History  . Marital status: Legally Separated    Spouse name: Not on file  . Number of children: 2  . Years of education: Not on file  . Highest education level: Not on file  Occupational History  . 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    Comment: [employer]  Tobacco Use  . Smoking status: Former Smoker    Packs/day: 0.05    Years: 20.00    Pack years: 1.00     Types: Cigarettes    Quit date: 02/10/2015    Years since quitting: 4.8  . Smokeless tobacco: Never Used  Substance and Sexual Activity  . Alcohol use: Yes    Alcohol/week: 2.0 standard drinks    Types: 2 Glasses of wine per week    Comment: socially  . Drug use: No  . Sexual activity: Yes    Birth control/protection: None  Other Topics Concern  . Not on file  Social History Narrative   Separated   2 children   Works as an Engineer, structural PA   Enjoys gym, kayak, paddle, boarding, outside activities   Social Determinants of Radio broadcast assistant Strain:   . Difficulty of Paying Living Expenses: Not on file  Food Insecurity:   . Worried About Charity fundraiser in the Last Year: Not on file  . Ran Out of Food in the Last Year: Not on file  Transportation Needs:   . Lack of Transportation (Medical): Not on file  . Lack of Transportation (Non-Medical): Not on file  Physical Activity:   . Days of Exercise per Week: Not on file  . Minutes of Exercise per Session: Not on file  Stress:   . Feeling of Stress : Not on file  Social Connections:   .  Frequency of Communication with Friends and Family: Not on file  . Frequency of Social Gatherings with Friends and Family: Not on file  . Attends Religious Services: Not on file  . Active Member of Clubs or Organizations: Not on file  . Attends Archivist Meetings: Not on file  . Marital Status: Not on file  Intimate Partner Violence:   . Fear of Current or Ex-Partner: Not on file  . Emotionally Abused: Not on file  . Physically Abused: Not on file  . Sexually Abused: Not on file    Past Surgical History:  Procedure Laterality Date  . COLONOSCOPY W/ POLYPECTOMY    . [place] N/A 10/17/2012   Procedure: DILATATION & CURETTAGE/HYSTEROSCOPY WITH NOVASURE ABLATION;  Surgeon: Daria Pastures, MD;  Location: [facility];  Service: Gynecology;  Laterality: N/A;  . EYE  SURGERY     PRK - bilateral eyes  . LAPAROSCOPIC TUBAL LIGATION Bilateral 07/15/2015   Procedure: LAPAROSCOPIC TUBAL LIGATION;  Surgeon: Bobbye Charleston, MD;  Location: [facility];  Service: Gynecology;  Laterality: Bilateral;  FILSHIE CLIPS  . left leg vein surgery Bilateral    Laser  . left wrist surgery  2012   fracture repair  . LUMBAR LAMINECTOMY/DECOMPRESSION MICRODISCECTOMY N/A 10/28/2015   Procedure: LUMBAR 4-5 DECOMPRESSION ;  Surgeon: Phylliss Bob, MD;  Location: [facility];  Service: Orthopedics;  Laterality: N/A;  LUMBAR 4-5 DECOMPRESSION   . RECTOCELE REPAIR N/A 10/17/2012   Procedure: POSTERIOR REPAIR (RECTOCELE);  Surgeon: Daria Pastures, MD;  Location: [facility];  Service: Gynecology;  Laterality: N/A;  . RIGHT ANKLE SURGERY  1996   RECONSTRUCTION  . WISDOM TOOTH EXTRACTION      Family History  Problem Relation Age of Onset  . Colon cancer Mother   . Hyperlipidemia Mother   . Hypertension Mother   . Colon cancer Father   . Heart disease Father   . Hypertension Father   . Stroke Paternal Grandfather   . Hypertension Maternal Grandmother     Allergies  Allergen Reactions  . Erythromycin Diarrhea    Current Outpatient Medications on File Prior to Visit  Medication Sig Dispense Refill  . albuterol (VENTOLIN HFA) 108 (90 Base) MCG/ACT inhaler Inhale 2 puffs into the lungs every 6 (six) hours as needed for wheezing or shortness of breath. 1 Inhaler 0  . clonazePAM (KLONOPIN) 0.5 MG tablet Take 1 tablet (0.5 mg total) by mouth 2 (two) times daily as needed for anxiety. 20 tablet 0  . estradiol (ESTRACE) 0.5 MG tablet TK 1 T PO QD  4  . hydrochlorothiazide (HYDRODIURIL) 25 MG tablet TAKE 1 TABLET(25 MG) BY MOUTH DAILY 30 tablet 0  . levothyroxine (SYNTHROID) 50 MCG tablet TAKE 1 TABLET(50 MCG) BY MOUTH DAILY BEFORE BREAKFAST 30 tablet 2  . lisinopril (ZESTRIL) 20 MG tablet TAKE 1 TABLET(20 MG) BY MOUTH DAILY.  Needs ov 90 tablet 0  . potassium chloride SA (KLOR-CON) 20 MEQ  tablet Take 1 tablet (20 mEq total) by mouth daily. 30 tablet 3  . rizatriptan (MAXALT) 10 MG tablet Take 1 tablet (10 mg total) by mouth as needed for migraine. May repeat in 2 hours if needed 10 tablet 5  . valACYclovir (VALTREX) 1000 MG tablet 2 tabs by mouth at start of cold sore then 2 tabs 12 hours later 20 tablet 1  . venlafaxine XR (EFFEXOR XR) 75 MG 24 hr capsule Take 1 capsule (75 mg total) by mouth daily with breakfast.  90 capsule 0   No current facility-administered medications on file prior to visit.    BP 127/83 (BP Location: Right Arm, Patient Position: Sitting, Cuff Size: Small)   Pulse 67   Temp 98.6 F (37 C) (Oral)   Resp 16   Ht 5' 5.7" (1.669 m)   Wt 167 lb (75.8 kg)   SpO2 100%   BMI 27.20 kg/m       Objective:   Physical Exam Constitutional:      Appearance: She is well-developed.  Neck:     Thyroid: No thyromegaly.  Cardiovascular:     Rate and Rhythm: Normal rate and regular rhythm.     Heart sounds: Normal heart sounds. No murmur heard.   Pulmonary:     Effort: Pulmonary effort is normal. No respiratory distress.     Breath sounds: Normal breath sounds. No wheezing.  Musculoskeletal:     Cervical back: Neck supple.  Skin:    General: Skin is warm and dry.  Neurological:     Mental Status: She is alert and oriented to person, place, and time.  Psychiatric:        Behavior: Behavior normal.        Thought Content: Thought content normal.        Judgment: Judgment normal.           Assessment & Plan:  HTN- bp stable . Reports not taking k+ regularly due to size of pills. Will change Kdur from 51meq tabs to 66meq tabs. Continue current meds. Obtain bmet.  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 Requesting refill of valtrex.  Migraines- reports well controlled. Continue imitrex prn.  Hypothyroid- clinically stable on synthroid. Continue same. Obtain tsh.  Depression/anxiety- rare use of clonazepam.  Will update controlled  substance contract and UDS. Continue effexor.  This visit occurred during the SARS-CoV-2 public health emergency.  Safety protocols were in place, including screening questions prior to the visit, additional usage of staff PPE, and extensive cleaning of exam room while observing appropriate contact time as indicated for disinfecting solutions.

## 2019-12-18 LAB — DRUG MONITORING, PANEL 8 WITH CONFIRMATION, URINE
6 Acetylmorphine: NEGATIVE ng/mL (ref <10)
Alcohol Metabolites: POSITIVE ng/mL — AB
Amphetamines: NEGATIVE ng/mL (ref <500)
Benzodiazepines: NEGATIVE ng/mL (ref <100)
Buprenorphine, Urine: NEGATIVE ng/mL (ref <5)
Cocaine Metabolite: NEGATIVE ng/mL (ref <150)
Creatinine: 89.5 mg/dL
Ethyl Glucuronide (ETG): 526 ng/mL — ABNORMAL HIGH (ref <500)
Ethyl Sulfate (ETS): 138 ng/mL — ABNORMAL HIGH (ref <100)
MDMA: NEGATIVE ng/mL (ref <500)
Marijuana Metabolite: NEGATIVE ng/mL (ref <20)
Opiates: NEGATIVE ng/mL (ref <100)
Oxidant: NEGATIVE ug/mL
Oxycodone: NEGATIVE ng/mL (ref <100)
pH: 7.7 (ref 4.5–9.0)

## 2019-12-18 LAB — DM TEMPLATE

## 2020-01-01 ENCOUNTER — Encounter: Payer: Self-pay | Admitting: Gastroenterology

## 2020-01-07 ENCOUNTER — Other Ambulatory Visit: Payer: Self-pay | Admitting: Family

## 2020-02-06 ENCOUNTER — Other Ambulatory Visit: Payer: Self-pay | Admitting: Family

## 2020-02-17 ENCOUNTER — Ambulatory Visit (AMBULATORY_SURGERY_CENTER): Payer: Self-pay | Admitting: *Deleted

## 2020-02-17 ENCOUNTER — Other Ambulatory Visit: Payer: Self-pay

## 2020-02-17 ENCOUNTER — Encounter: Payer: Self-pay | Admitting: Internal Medicine

## 2020-02-17 VITALS — Ht 66.0 in | Wt 168.0 lb

## 2020-02-17 DIAGNOSIS — Z86010 Personal history of colonic polyps: Secondary | ICD-10-CM

## 2020-02-17 DIAGNOSIS — Z800 Family history of malignant neoplasm of digestive organs: Secondary | ICD-10-CM

## 2020-02-17 MED ORDER — SUPREP BOWEL PREP KIT 17.5-3.13-1.6 GM/177ML PO SOLN: 1.0000 | Freq: Once | ORAL | 0 refills | Status: AC

## 2020-02-17 NOTE — Progress Notes (Signed)
No egg or soy allergy known to patient   issues with past sedation with any surgeries or procedures- PONV  no intubation problems in the past  No FH of Malignant Hyperthermia No diet pills per patient No home 02 use per patient  No blood thinners per patient  Pt denies issues with constipation  No A fib or A flutter  EMMI video to pt or via [place] 19 guidelines implemented in PV today with Pt and RN   cov vax x 2    Suprep  Coupon given to pt in PV today , Code to Pharmacy   Due to the COVID-19 pandemic we are asking patients to follow these guidelines. Please only bring one care partner. Please be aware that your care partner may wait in the car in the parking lot or if they feel like they will be too hot to wait in the car, they may wait in the lobby on the 4th floor. All care partners are required to wear a mask the entire time (we do not have any that we can provide them), they need to practice social distancing, and we will do a Covid check for all patient's and care partners when you arrive. Also we will check their temperature and your temperature. If the care partner waits in their car they need to stay in the parking lot the entire time and we will call them on their cell phone when the patient is ready for discharge so they can bring the car to the front of the building. Also all patient's will need to wear a mask into building.

## 2020-02-28 ENCOUNTER — Telehealth: Payer: Self-pay

## 2020-02-28 NOTE — Telephone Encounter (Signed)
Left VM to remind patient of procedure with Dr. Ardis Hughs on Monday at [facility].

## 2020-03-02 ENCOUNTER — Other Ambulatory Visit: Payer: Self-pay

## 2020-03-02 ENCOUNTER — Ambulatory Visit (AMBULATORY_SURGERY_CENTER): Payer: PRIVATE HEALTH INSURANCE | Admitting: Gastroenterology

## 2020-03-02 ENCOUNTER — Encounter: Payer: Self-pay | Admitting: Gastroenterology

## 2020-03-02 VITALS — BP 126/87 | HR 65 | Temp 97.0°F | Resp 9 | Ht 66.0 in | Wt 168.0 lb

## 2020-03-02 DIAGNOSIS — Z86010 Personal history of colonic polyps: Secondary | ICD-10-CM | POA: Diagnosis present

## 2020-03-02 MED ORDER — SODIUM CHLORIDE 0.9 % IV SOLN: 500.0000 mL | Freq: Once | INTRAVENOUS | Status: DC

## 2020-03-02 NOTE — Progress Notes (Signed)
0852 Patient experiencing nausea.  MD updated and Zofran 4 mg IV given, vss

## 2020-03-02 NOTE — Progress Notes (Signed)
Report given to PACU, vss 

## 2020-03-02 NOTE — Op Note (Signed)
[facility] Patient Name: Whitney King Procedure Date: 03/02/2020 8:46 AM MRN: [id number] Endoscopist: Milus Banister , MD Age: 49 Referring MD:  Date of Birth: 08-21-1970 Gender: Female Account #: [id number] Procedure:                Colonoscopy Indications:              High risk colon cancer surveillance: Personal                            history of colonic polyps and FH of colon cancer                            (mother and father): colonoscopy 2016 single subCM                            adenoma removed. Medicines:                Monitored Anesthesia Care Procedure:                Pre-Anesthesia Assessment:                           - Prior to the procedure, a History and Physical                            was performed, and patient medications and                            allergies were reviewed. The patient's tolerance of                            previous anesthesia was also reviewed. The risks                            and benefits of the procedure and the sedation                            options and risks were discussed with the patient.                            All questions were answered, and informed consent                            was obtained. Prior Anticoagulants: The patient has                            taken no previous anticoagulant or antiplatelet                            agents. ASA Grade Assessment: II - A patient with                            mild systemic disease. After reviewing the risks  and benefits, the patient was deemed in                            satisfactory condition to undergo the procedure.                           After obtaining informed consent, the colonoscope                            was passed under direct vision. Throughout the                            procedure, the patient's blood pressure, pulse, and                            oxygen saturations were monitored  continuously. The                            Colonoscope was introduced through the anus and                            advanced to the the cecum, identified by                            appendiceal orifice and ileocecal valve. The                            colonoscopy was performed without difficulty. The                            patient tolerated the procedure well. The quality                            of the bowel preparation was good. The ileocecal                            valve, appendiceal orifice, and rectum were                            photographed. Scope In: 8:55:21 AM Scope Out: 9:05:52 AM Scope Withdrawal Time: 0 hours 6 minutes 45 seconds  Total Procedure Duration: 0 hours 10 minutes 31 seconds  Findings:                 The entire examined colon appeared normal on direct                            and retroflexion views. Complications:            No immediate complications. Estimated blood loss:                            None. Estimated Blood Loss:     Estimated blood loss: none. Impression:               - The entire examined colon is normal  on direct and                            retroflexion views.                           - No polyps or cancers. Recommendation:           - Patient has a contact number available for                            emergencies. The signs and symptoms of potential                            delayed complications were discussed with the                            patient. Return to normal activities tomorrow.                            Written discharge instructions were provided to the                            patient.                           - Resume previous diet.                           - Continue present medications.                           - Repeat colonoscopy in 5 years for screening                            purposes. Milus Banister, MD 03/02/2020 9:08:33 AM This report has been signed electronically.

## 2020-03-02 NOTE — Patient Instructions (Signed)
Repeat colonoscopy in 5 years for screening purposes. ° °YOU HAD AN ENDOSCOPIC PROCEDURE TODAY AT THE [facility]:   Refer to the procedure report that was given to you for any specific questions about what was found during the examination.  If the procedure report does not answer your questions, please call your gastroenterologist to clarify.  If you requested that your care partner not be given the details of your procedure findings, then the procedure report has been included in a sealed envelope for you to review at your convenience later. ° °YOU SHOULD EXPECT: Some feelings of bloating in the abdomen. Passage of more gas than usual.  Walking can help get rid of the air that was put into your GI tract during the procedure and reduce the bloating. If you had a lower endoscopy (such as a colonoscopy or flexible sigmoidoscopy) you may notice spotting of blood in your stool or on the toilet paper. If you underwent a bowel prep for your procedure, you may not have a normal bowel movement for a few days. ° °Please Note:  You might notice some irritation and congestion in your nose or some drainage.  This is from the oxygen used during your procedure.  There is no need for concern and it should clear up in a day or so. ° °SYMPTOMS TO REPORT IMMEDIATELY: ° °Following lower endoscopy (colonoscopy or flexible sigmoidoscopy): ° Excessive amounts of blood in the stool ° Significant tenderness or worsening of abdominal pains ° Swelling of the abdomen that is new, acute ° Fever of 100°F or higher ° °For urgent or emergent issues, a gastroenterologist can be reached at any hour by calling [phone]. °Do not use MyChart messaging for urgent concerns.  ° ° °DIET:  We do recommend a small meal at first, but then you may proceed to your regular diet.  Drink plenty of fluids but you should avoid alcoholic beverages for 24 hours. ° °ACTIVITY:  You should plan to take it easy for the rest of today and you should NOT  DRIVE or use heavy machinery until tomorrow (because of the sedation medicines used during the test).   ° °FOLLOW UP: °Our staff will call the number listed on your records 48-72 hours following your procedure to check on you and address any questions or concerns that you may have regarding the information given to you following your procedure. If we do not reach you, we will leave a message.  We will attempt to reach you two times.  During this call, we will ask if you have developed any symptoms of COVID 19. If you develop any symptoms (ie: fever, flu-like symptoms, shortness of breath, cough etc.) before then, please call [phone].  If you test positive for Covid 19 in the 2 weeks post procedure, please call and report this information to us.   ° °If any biopsies were taken you will be contacted by phone or by letter within the next 1-3 weeks.  Please call us at [phone] if you have not heard about the biopsies in 3 weeks.  ° ° °SIGNATURES/CONFIDENTIALITY: °You and/or your care partner have signed paperwork which will be entered into your electronic medical record.  These signatures attest to the fact that that the information above on your After Visit Summary has been reviewed and is understood.  Full responsibility of the confidentiality of this discharge information lies with you and/or your care-partner. °  °

## 2020-03-04 ENCOUNTER — Telehealth: Payer: Self-pay | Admitting: *Deleted

## 2020-03-04 NOTE — Telephone Encounter (Signed)
Attempted f/u phone call. No answer. Left message. °

## 2020-03-04 NOTE — Telephone Encounter (Signed)
Second follow up call.

## 2020-03-10 ENCOUNTER — Other Ambulatory Visit: Payer: Self-pay | Admitting: Family

## 2020-06-18 LAB — HM PAP SMEAR: HM Pap smear: NEGATIVE

## 2020-06-18 LAB — HM MAMMOGRAPHY

## 2020-07-16 ENCOUNTER — Other Ambulatory Visit: Payer: Self-pay | Admitting: Family

## 2020-08-14 ENCOUNTER — Other Ambulatory Visit: Payer: Self-pay | Admitting: Family

## 2020-08-15 ENCOUNTER — Other Ambulatory Visit: Payer: Self-pay | Admitting: Family

## 2020-08-31 ENCOUNTER — Other Ambulatory Visit: Payer: Self-pay | Admitting: Family

## 2020-09-01 ENCOUNTER — Other Ambulatory Visit: Payer: Self-pay | Admitting: Family

## 2020-09-02 ENCOUNTER — Other Ambulatory Visit: Payer: Self-pay | Admitting: Family

## 2020-09-18 ENCOUNTER — Other Ambulatory Visit: Payer: Self-pay | Admitting: Family

## 2020-09-18 NOTE — Telephone Encounter (Signed)
lvm to schedule appt.  

## 2020-09-18 NOTE — Telephone Encounter (Signed)
I sent a 2 week supply of her medication. She is past due or follow up. Please contact pt to schedule a follow up visit.

## 2020-11-30 ENCOUNTER — Other Ambulatory Visit: Payer: Self-pay | Admitting: Family

## 2021-01-08 ENCOUNTER — Other Ambulatory Visit: Payer: Self-pay | Admitting: Family

## 2021-01-11 ENCOUNTER — Encounter: Payer: Self-pay | Admitting: Family

## 2021-01-11 ENCOUNTER — Other Ambulatory Visit: Payer: Self-pay | Admitting: Family

## 2021-01-20 ENCOUNTER — Other Ambulatory Visit: Payer: Self-pay

## 2021-01-20 ENCOUNTER — Telehealth: Payer: Self-pay | Admitting: Family

## 2021-01-20 ENCOUNTER — Encounter: Payer: Self-pay | Admitting: Family

## 2021-01-20 ENCOUNTER — Ambulatory Visit (INDEPENDENT_AMBULATORY_CARE_PROVIDER_SITE_OTHER): Payer: No Typology Code available for payment source | Admitting: Family

## 2021-01-20 VITALS — BP 134/84 | HR 80 | Temp 98.4°F | Resp 16 | Ht 66.0 in | Wt 152.0 lb

## 2021-01-20 DIAGNOSIS — E039 Hypothyroidism, unspecified: Secondary | ICD-10-CM

## 2021-01-20 DIAGNOSIS — L708 Other acne: Secondary | ICD-10-CM

## 2021-01-20 DIAGNOSIS — Z0000 Encounter for general adult medical examination without abnormal findings: Secondary | ICD-10-CM

## 2021-01-20 DIAGNOSIS — E876 Hypokalemia: Secondary | ICD-10-CM | POA: Diagnosis not present

## 2021-01-20 DIAGNOSIS — I10 Essential (primary) hypertension: Secondary | ICD-10-CM | POA: Diagnosis not present

## 2021-01-20 DIAGNOSIS — F419 Anxiety disorder, unspecified: Secondary | ICD-10-CM

## 2021-01-20 DIAGNOSIS — F32A Depression, unspecified: Secondary | ICD-10-CM

## 2021-01-20 DIAGNOSIS — E785 Hyperlipidemia, unspecified: Secondary | ICD-10-CM

## 2021-01-20 DIAGNOSIS — Z23 Encounter for immunization: Secondary | ICD-10-CM | POA: Diagnosis not present

## 2021-01-20 DIAGNOSIS — G43809 Other migraine, not intractable, without status migrainosus: Secondary | ICD-10-CM

## 2021-01-20 LAB — COMPREHENSIVE METABOLIC PANEL
ALT: 19 U/L (ref 0–35)
AST: 19 U/L (ref 0–37)
Albumin: 4.1 g/dL (ref 3.5–5.2)
Alkaline Phosphatase: 32 U/L — ABNORMAL LOW (ref 39–117)
BUN: 16 mg/dL (ref 6–23)
CO2: 29 mEq/L (ref 19–32)
Calcium: 10.1 mg/dL (ref 8.4–10.5)
Chloride: 102 mEq/L (ref 96–112)
Creatinine, Ser: 0.67 mg/dL (ref 0.40–1.20)
GFR: 101.59 mL/min (ref >60.00)
Glucose, Bld: 95 mg/dL (ref 70–99)
Potassium: 4.6 mEq/L (ref 3.5–5.1)
Sodium: 138 mEq/L (ref 135–145)
Total Bilirubin: 0.5 mg/dL (ref 0.2–1.2)
Total Protein: 6.7 g/dL (ref 6.0–8.3)

## 2021-01-20 LAB — TSH: TSH: 3.15 u[IU]/mL (ref 0.35–5.50)

## 2021-01-20 LAB — LIPID PANEL
Cholesterol: 200 mg/dL (ref 0–200)
HDL: 91.2 mg/dL (ref >39.00)
LDL Cholesterol: 90 mg/dL (ref 0–99)
NonHDL: 108.83
Total CHOL/HDL Ratio: 2
Triglycerides: 93 mg/dL (ref 0.0–149.0)
VLDL: 18.6 mg/dL (ref 0.0–40.0)

## 2021-01-20 LAB — T4, FREE: Free T4: 0.76 ng/dL (ref 0.60–1.60)

## 2021-01-20 LAB — T3, FREE: T3, Free: 2.8 pg/mL (ref 2.3–4.2)

## 2021-01-20 MED ORDER — LISINOPRIL 20 MG PO TABS: ORAL_TABLET | ORAL | 1 refills | Status: DC

## 2021-01-20 MED ORDER — VALACYCLOVIR HCL 1 G PO TABS: ORAL_TABLET | ORAL | 1 refills | Status: DC

## 2021-01-20 MED ORDER — POTASSIUM CHLORIDE ER 10 MEQ PO TBCR: EXTENDED_RELEASE_TABLET | ORAL | 1 refills | Status: DC

## 2021-01-20 MED ORDER — VENLAFAXINE HCL ER 75 MG PO CP24: ORAL_CAPSULE | ORAL | 1 refills | Status: DC

## 2021-01-20 MED ORDER — RIZATRIPTAN BENZOATE 10 MG PO TABS: 10.0000 mg | ORAL_TABLET | ORAL | 5 refills | Status: DC | PRN

## 2021-01-20 MED ORDER — HYDROCHLOROTHIAZIDE 25 MG PO TABS: ORAL_TABLET | ORAL | 1 refills | Status: DC

## 2021-01-20 NOTE — Progress Notes (Signed)
Subjective:   By signing my name below, I, Shehryar Baig, attest that this documentation has been prepared under the direction and in the presence of Debbrah Alar NP. 01/20/2021    Patient ID: Whitney King, female    DOB: 1971/04/01, 50 y.o.   MRN: [id number]  Chief Complaint  Patient presents with   Annual Exam    HPI Patient is in today for a comprehensive physical exam.  She denies having any unexpected weight change, ear pain, hearing loss and rhinorrhea, visual disturbance, cough, chest pain and leg swelling, nausea, vomiting, blood in stool, or dysuria and frequency, for myalgias and arthralgias, rash, headaches, adenopathy, depression or anxiety at this time. Social history- She reports her mother and maternal aunt has a history of a-fib, otherwise she has no recent changes to her family medical history. She had an emergency root canal procedure, otherwise no recent surgical procedures. She does not use drugs. She occasionally drinks alcohol. She does not use tobacco products. She does not use vaping products.  Maxalt- She is requesting a refill on 10 mg Maxalt daily PO.  Thyroid- She is requesting to start taking levothyroxine again.   Constipation/diarrhea- She has either constipation or diarrhea daily.  Sleep- She occasionally struggles sleeping and takes melatonin supplements to manage her symptoms.  Potassium- She is requesting a refill for potassium supplements.  Mood- She continues taking 75 mg Effexor-xr daily PO and is requesting a refill on it.  Blood pressure- She continues taking 25 mg hydrochlorothiazide daily PO, 20 mg lisinopril daily PO and is requesting a refill on them as well.   BP Readings from Last 3 Encounters:  01/20/21 134/84  03/02/20 126/87  12/16/19 127/83   Pulse Readings from Last 3 Encounters:  01/20/21 80  03/02/20 65  12/16/19 67   Immunizations: She is due for the at tetanus vaccine and is interested in getting it during this  visit. She is getting the flu vaccine during this visit. She has 2 Covid-19 vaccines and is not interested in getting the booster vaccine. She is eligible for a shingles vaccine and is willing to get it at a later time at her pharmacy.  Diet: She is managing a healthy diet.  Exercise: She is participating in regular exercise.  Colonoscopy: Last completed 03/02/2020. Results are normal. Repeat in 5 years.  Pap Smear: Last completed 10/23/2016. Results were abnormal. Repeat in 3 years. Due.   Mammogram: She reports her last mammogram was last year and that her results were normal.  Dental: She is UTD on dental care. She reports having an emergency root canal this year.  Vision: She is UTD on vision care.   Health Maintenance Due  Topic Date Due   Hepatitis C Screening  Never done   MAMMOGRAM  01/03/2019   TETANUS/TDAP  06/25/2019   COVID-19 Vaccine (3 - Booster for Pfizer series) 10/06/2019   Zoster Vaccines- Shingrix (1 of 2) Never done   INFLUENZA VACCINE  11/23/2020   PAP SMEAR-Modifier  01/02/2021    Past Medical History:  Diagnosis Date   ABNORMAL FINDINGS, ELEVATED BP W/O HTN 09/13/2006   Qualifier: Diagnosis of  By: Larose Kells MD, [place] [facility] 09/08/2006   Qualifier: Diagnosis of  By: Larose Kells MD, [place]    Allergy    Anxiety    Anxiety and depression 09/08/2006   Qualifier: Diagnosis of  By: Larose Kells MD, [place]    Arthritis    mild  Asthma    exercise induced, allergies, no problems since allergy shots   Bipolar disorder ([place])    pt denies    GERD (gastroesophageal reflux disease)    diet controlled only   Headache(784.0)    otc meds prn   HTN (hypertension) 03/16/2015   Hypertension    no medications at this time- post pregnancy   Hypothyroidism 05/18/2016   IBS (irritable bowel syndrome)    Mental disorder    Migraines    OTHER&UNSPECIFIED DISEASES THE ORAL SOFT TISSUES 03/24/2009   Qualifier: Diagnosis of  By: Linna Darner MD, [place]     PONV (postoperative nausea and  vomiting)    Positive ANA (antinuclear antibody) 07/26/2017   Seasonal allergies    SVD (spontaneous vaginal delivery)    x 1    Past Surgical History:  Procedure Laterality Date   COLONOSCOPY     COLONOSCOPY W/ POLYPECTOMY     DILITATION & CURRETTAGE/HYSTROSCOPY WITH NOVASURE ABLATION N/A 10/17/2012   Procedure: DILATATION & CURETTAGE/HYSTEROSCOPY WITH NOVASURE ABLATION;  Surgeon: Daria Pastures, MD;  Location: [facility];  Service: Gynecology;  Laterality: N/A;   EYE SURGERY     PRK - bilateral eyes   LAPAROSCOPIC TUBAL LIGATION Bilateral 07/15/2015   Procedure: LAPAROSCOPIC TUBAL LIGATION;  Surgeon: Bobbye Charleston, MD;  Location: [facility];  Service: Gynecology;  Laterality: Bilateral;  FILSHIE CLIPS   left leg vein surgery Bilateral    Laser   left wrist surgery  2012   fracture repair   LUMBAR LAMINECTOMY/DECOMPRESSION MICRODISCECTOMY N/A 10/28/2015   Procedure: LUMBAR 4-5 DECOMPRESSION ;  Surgeon: Phylliss Bob, MD;  Location: [facility];  Service: Orthopedics;  Laterality: N/A;  LUMBAR 4-5 DECOMPRESSION - cyst removed that was pressing on nerve per pt    POLYPECTOMY     RECTOCELE REPAIR N/A 10/17/2012   Procedure: POSTERIOR REPAIR (RECTOCELE);  Surgeon: Daria Pastures, MD;  Location: [facility];  Service: Gynecology;  Laterality: N/A;   RIGHT ANKLE SURGERY  1996   RECONSTRUCTION   WISDOM TOOTH EXTRACTION      Family History  Problem Relation Age of Onset   Colon cancer Mother        in her 15's    Hyperlipidemia Mother    Hypertension Mother    Colon polyps Mother    Atrial fibrillation Mother        ablation   Colon cancer Father 71       79-80 or so    Heart disease Father    Hypertension Father    Hypertension Maternal Grandmother    Stroke Paternal Grandfather    Esophageal cancer Neg Hx    Rectal cancer Neg Hx    Stomach cancer Neg Hx     Social History   Socioeconomic History   Marital status: Divorced    Spouse name: Not on file   Number of children: 2    Years of education: Not on file   Highest education level: Not on file  Occupational History   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    Comment: [employer]  Tobacco Use   Smoking status: Former    Packs/day: 0.05    Years: 20.00    Pack years: 1.00    Types: Cigarettes    Quit date: 02/10/2015    Years since quitting: 5.9   Smokeless tobacco: Never  Substance and Sexual Activity   Alcohol use: Yes    Alcohol/week: 2.0 standard drinks    Types: 2 Glasses of wine per week  Comment: socially   Drug use: No   Sexual activity: Yes    Birth control/protection: None  Other Topics Concern   Not on file  Social History Narrative   Separated   2 children   Works as an Engineer, structural PA   Enjoys gym, kayak, paddle, boarding, outside activities   Social Determinants of Radio broadcast assistant Strain: Not on file  Food Insecurity: Not on file  Transportation Needs: Not on file  Physical Activity: Not on file  Stress: Not on file  Social Connections: Not on file  Intimate Partner Violence: Not on file    Outpatient Medications Prior to Visit  Medication Sig Dispense Refill   estradiol (ESTRACE) 0.5 MG tablet TK 1 T PO QD  4   metroNIDAZOLE (METROGEL) 1 % gel metronidazole 1 % topical gel  APP EXT AA QD     progesterone (PROMETRIUM) 100 MG capsule Take 100 mg by mouth daily.     clonazePAM (KLONOPIN) 0.5 MG tablet Take 1 tablet (0.5 mg total) by mouth 2 (two) times daily as needed for anxiety. 20 tablet 0   hydrochlorothiazide (HYDRODIURIL) 25 MG tablet TAKE 1 TABLET(25 MG) BY MOUTH DAILY 15 tablet 0   lisinopril (ZESTRIL) 20 MG tablet TAKE 1 TABLET(20 MG) BY MOUTH DAILY 90 tablet 1   potassium chloride (KLOR-CON) 10 MEQ tablet TAKE 2 TABLETS(20 MEQ) BY MOUTH DAILY 60 tablet 0   rizatriptan (MAXALT) 10 MG tablet Take 1 tablet (10 mg total) by mouth as needed for migraine. May repeat in 2 hours if needed 10 tablet 5   valACYclovir (VALTREX) 1000 MG tablet 2 tabs by mouth at  start of cold sore then 2 tabs 12 hours later 20 tablet 1   venlafaxine XR (EFFEXOR-XR) 75 MG 24 hr capsule TAKE 1 CAPSULE(75 MG) BY MOUTH DAILY WITH BREAKFAST 30 capsule 0   albuterol (VENTOLIN HFA) 108 (90 Base) MCG/ACT inhaler Inhale 2 puffs into the lungs every 6 (six) hours as needed for wheezing or shortness of breath. 1 Inhaler 0   doxycycline (VIBRAMYCIN) 50 MG capsule Take 50 mg by mouth daily.     levothyroxine (SYNTHROID) 50 MCG tablet TAKE 1 TABLET(50 MCG) BY MOUTH DAILY BEFORE BREAKFAST 15 tablet 0   No facility-administered medications prior to visit.    Allergies  Allergen Reactions   Erythromycin Diarrhea    Review of Systems  Constitutional:        (-)unexpected weight change (-)Adenopathy  HENT:  Negative for hearing loss.        (-)Rhinorrhea   Eyes:        (-)Visual disturbance  Respiratory:  Negative for cough.   Cardiovascular:  Negative for chest pain and leg swelling.  Gastrointestinal:  Positive for constipation and diarrhea. Negative for blood in stool, nausea and vomiting.  Genitourinary:  Negative for dysuria and frequency.  Musculoskeletal:  Negative for joint pain and myalgias.  Skin:  Negative for rash.  Neurological:  Negative for headaches.  Psychiatric/Behavioral:  Negative for depression. The patient is not nervous/anxious.       Objective:    Physical Exam Constitutional:      General: She is not in acute distress.    Appearance: Normal appearance. She is not ill-appearing.  HENT:     Head: Normocephalic and atraumatic.     Right Ear: Tympanic membrane, ear canal and external ear normal.     Left Ear: Tympanic membrane, ear canal and external ear normal.  Eyes:  Extraocular Movements: Extraocular movements intact.     Pupils: Pupils are equal, round, and reactive to light.     Comments: No nystagmus  Cardiovascular:     Rate and Rhythm: Normal rate and regular rhythm.     Heart sounds: Normal heart sounds. No murmur heard.   No  gallop.  Pulmonary:     Effort: Pulmonary effort is normal. No respiratory distress.     Breath sounds: Normal breath sounds. No wheezing or rales.  Abdominal:     General: There is no distension.     Palpations: Abdomen is soft.     Tenderness: There is no abdominal tenderness. There is no guarding.  Musculoskeletal:     Comments: 5/5 strength in both upper and lower extremities  Skin:    General: Skin is warm and dry.  Neurological:     Mental Status: She is alert and oriented to person, place, and time.     Deep Tendon Reflexes:     Reflex Scores:      Patellar reflexes are 3+ on the right side and 3+ on the left side. Psychiatric:        Behavior: Behavior normal.        Judgment: Judgment normal.    BP 134/84 (BP Location: Right Arm, Patient Position: Sitting, Cuff Size: Small)   Pulse 80   Temp 98.4 F (36.9 C) (Oral)   Resp 16   Ht '5\' 6"'  (1.676 m)   Wt 152 lb (68.9 kg)   SpO2 100%   BMI 24.53 kg/m  Wt Readings from Last 3 Encounters:  01/20/21 152 lb (68.9 kg)  03/02/20 168 lb (76.2 kg)  02/17/20 168 lb (76.2 kg)       Assessment & Plan:   Problem List Items Addressed This Visit       Unprioritized   Preventative health care    Continue healthy diet, exercise. Mammo/pap/colo up to date. Tdap and flu shot today. Declines covid booster. Wishes to get shingrix at the pharmacy.       Migraines    Stable. Refill send for maxalt.       Relevant Medications   rizatriptan (MAXALT) 10 MG tablet   hydrochlorothiazide (HYDRODIURIL) 25 MG tablet   lisinopril (ZESTRIL) 20 MG tablet   venlafaxine XR (EFFEXOR-XR) 75 MG 24 hr capsule   Hypothyroidism - Primary    Stopped Synthroid. Repeat TSH. Will see if we need to resume.       Relevant Orders   TSH   T3, free   T4, free   HTN (hypertension)    BP Readings from Last 3 Encounters:  01/20/21 134/84  03/02/20 126/87  12/16/19 127/83  BP stable. Continue current meds      Relevant Medications    hydrochlorothiazide (HYDRODIURIL) 25 MG tablet   lisinopril (ZESTRIL) 20 MG tablet   Other Relevant Orders   Comp Met (CMET)   Anxiety and depression    Stable on effexor xr 38m once daily. No longer needing clonazepam. D/c.       Relevant Medications   venlafaxine XR (EFFEXOR-XR) 75 MG 24 hr capsule   [facility]    Followed by dermatology.       Other Visit Diagnoses     Hypokalemia       Relevant Medications   potassium chloride (KLOR-CON) 10 MEQ tablet   Hyperlipidemia, unspecified hyperlipidemia type       Relevant Medications   hydrochlorothiazide (HYDRODIURIL) 25 MG tablet  lisinopril (ZESTRIL) 20 MG tablet   Other Relevant Orders   Lipid panel        Meds ordered this encounter  Medications   rizatriptan (MAXALT) 10 MG tablet    Sig: Take 1 tablet (10 mg total) by mouth as needed for migraine. May repeat in 2 hours if needed    Dispense:  10 tablet    Refill:  5    Order Specific Question:   Supervising Provider    Answer:   Penni Homans A [[id number]]   hydrochlorothiazide (HYDRODIURIL) 25 MG tablet    Sig: TAKE 1 TABLET(25 MG) BY MOUTH DAILY    Dispense:  90 tablet    Refill:  1    Order Specific Question:   Supervising Provider    Answer:   Penni Homans A [[id number]]   lisinopril (ZESTRIL) 20 MG tablet    Sig: TAKE 1 TABLET(20 MG) BY MOUTH DAILY    Dispense:  90 tablet    Refill:  1    Order Specific Question:   Supervising Provider    Answer:   Penni Homans A [[id number]]   potassium chloride (KLOR-CON) 10 MEQ tablet    Sig: TAKE 2 TABLETS(20 MEQ) BY MOUTH DAILY    Dispense:  180 tablet    Refill:  1    Requested drug refills are authorized, however, the patient needs further evaluation and/or laboratory testing before further refills are given. Ask her to make an appointment for this.    Order Specific Question:   Supervising Provider    Answer:   Penni Homans A [[id number]]   valACYclovir (VALTREX) 1000 MG tablet    Sig: 2 tabs by mouth at start of cold sore then  2 tabs 12 hours later    Dispense:  20 tablet    Refill:  1    Order Specific Question:   Supervising Provider    Answer:   Penni Homans A [[id number]]   venlafaxine XR (EFFEXOR-XR) 75 MG 24 hr capsule    Sig: TAKE 1 CAPSULE(75 MG) BY MOUTH DAILY WITH BREAKFAST    Dispense:  90 capsule    Refill:  1    Requested drug refills are authorized, however, the patient needs further evaluation and/or laboratory testing before further refills are given. Ask her to make an appointment for this.    Order Specific Question:   Supervising Provider    Answer:   Mosie Lukes [[id number]]    I, Debbrah Alar NP, personally preformed the services described in this documentation.  All medical record entries made by the scribe were at my direction and in my presence.  I have reviewed the chart and discharge instructions (if applicable) and agree that the record reflects my personal performance and is accurate and complete. 01/20/2021   I,Shehryar Baig,acting as a scribe for Nance Pear, NP.,have documented all relevant documentation on the behalf of Nance Pear, NP,as directed by  Nance Pear, NP while in the presence of Nance Pear, NP.   Nance Pear, NP

## 2021-01-20 NOTE — Assessment & Plan Note (Signed)
Stable on effexor xr 75mg  once daily. No longer needing clonazepam. D/c.

## 2021-01-20 NOTE — Assessment & Plan Note (Signed)
BP Readings from Last 3 Encounters:  01/20/21 134/84  03/02/20 126/87  12/16/19 127/83   BP stable. Continue current meds

## 2021-01-20 NOTE — Telephone Encounter (Signed)
Please call Dr. Nicki Reaper office and request pap and mammo

## 2021-01-20 NOTE — Assessment & Plan Note (Signed)
Followed by dermatology

## 2021-01-20 NOTE — Assessment & Plan Note (Signed)
Continue healthy diet, exercise. Mammo/pap/colo up to date. Tdap and flu shot today. Declines covid booster. Wishes to get shingrix at the pharmacy.

## 2021-01-20 NOTE — Assessment & Plan Note (Signed)
Stable. Refill send for maxalt.

## 2021-01-20 NOTE — Assessment & Plan Note (Signed)
Stopped Synthroid. Repeat TSH. Will see if we need to resume.

## 2021-01-20 NOTE — Telephone Encounter (Signed)
Records release will be faxed to [facility]

## 2021-01-20 NOTE — Patient Instructions (Signed)
Please complete lab work prior to leaving.   

## 2021-01-22 ENCOUNTER — Encounter: Payer: PRIVATE HEALTH INSURANCE | Admitting: Family

## 2021-02-17 ENCOUNTER — Other Ambulatory Visit: Payer: Self-pay | Admitting: Orthopedic Surgery

## 2021-02-18 ENCOUNTER — Other Ambulatory Visit: Payer: Self-pay | Admitting: Orthopedic Surgery

## 2021-02-18 DIAGNOSIS — M545 Low back pain, unspecified: Secondary | ICD-10-CM

## 2021-02-27 ENCOUNTER — Other Ambulatory Visit: Payer: Self-pay | Admitting: Family

## 2021-02-27 DIAGNOSIS — I10 Essential (primary) hypertension: Secondary | ICD-10-CM

## 2021-03-01 ENCOUNTER — Other Ambulatory Visit: Payer: No Typology Code available for payment source

## 2021-03-03 ENCOUNTER — Other Ambulatory Visit: Payer: Self-pay | Admitting: Orthopedic Surgery

## 2021-03-03 ENCOUNTER — Other Ambulatory Visit: Payer: Self-pay

## 2021-03-03 ENCOUNTER — Ambulatory Visit
Admission: RE | Admit: 2021-03-03 | Discharge: 2021-03-03 | Disposition: A | Discharge status: Home / Self Care | Payer: No Typology Code available for payment source | Source: Ambulatory Visit | Attending: Orthopedic Surgery | Admitting: Orthopedic Surgery

## 2021-03-03 DIAGNOSIS — M545 Low back pain, unspecified: Secondary | ICD-10-CM

## 2021-03-03 IMAGING — XA DG FACET JT INJ L OR S SPINE SINGLE LEVEL UNI
2 series · 2 of 2 positions shown · non-contrast
Comparison: none

CLINICAL DATA: Lumbosacral spondylosis without myelopathy. Lumbar
facet arthropathy. Low back and right leg pain. Right-sided synovial
cyst at L4-5 resulting in right lateral recess stenosis.

[Series 1: ortho standard · 1 of 1 slices shown (1 of 2)]
[im 1/1]
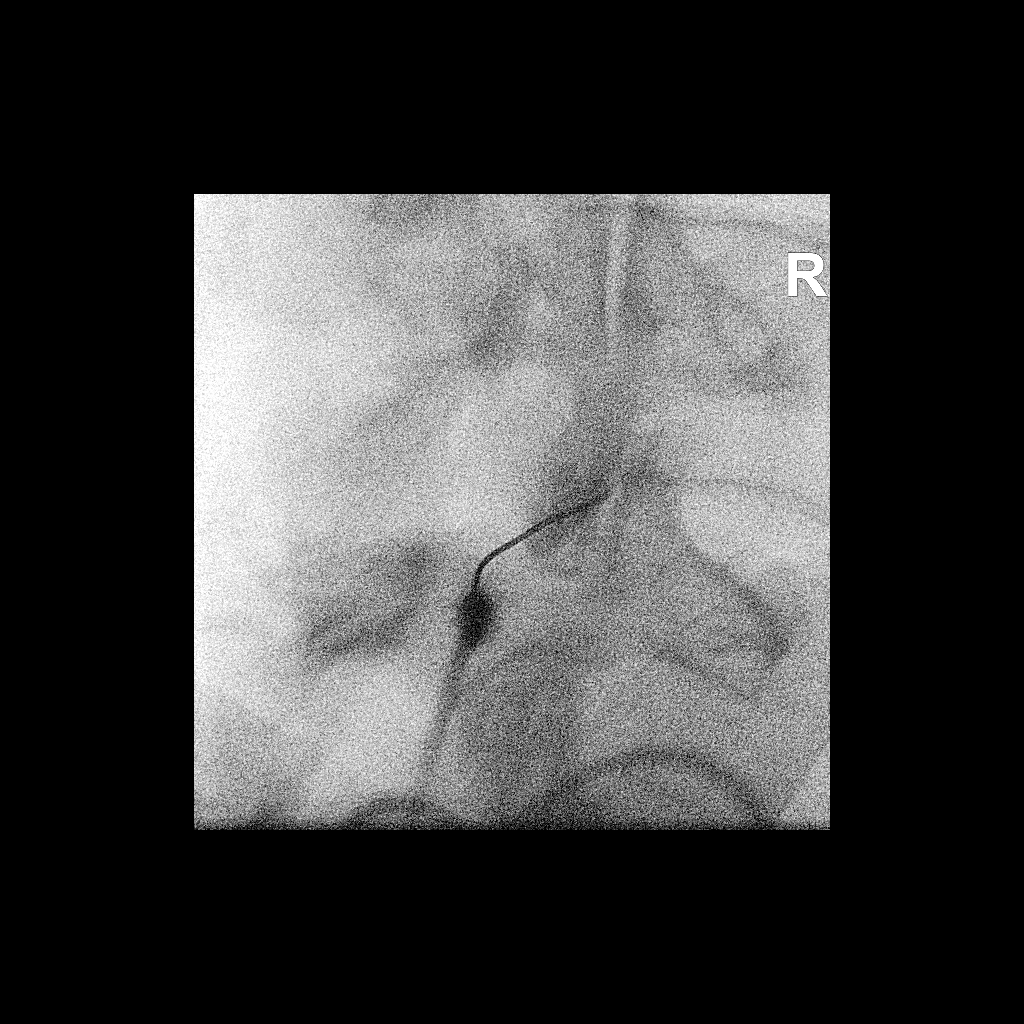

[Series 2: ortho standard · 1 of 1 slices shown (2 of 2)]
[im 1/1]
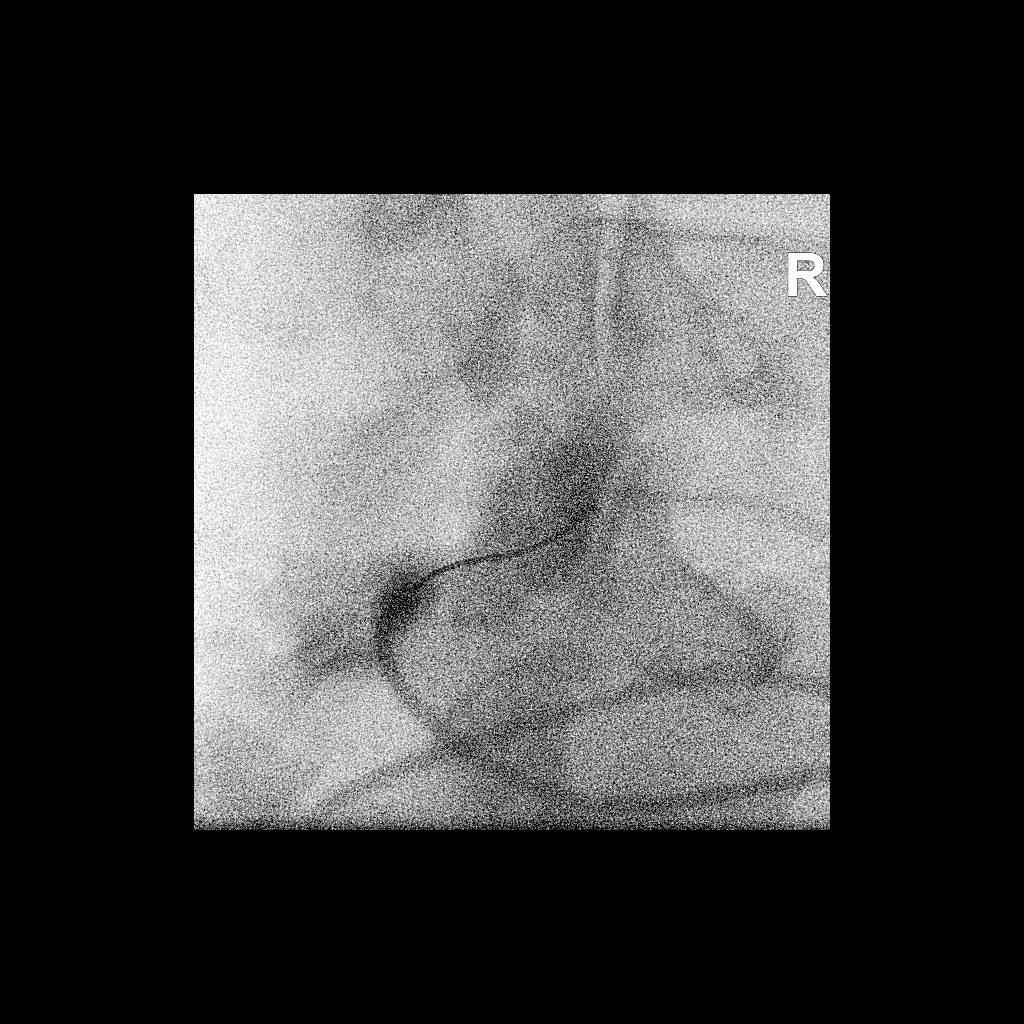

[2 of 2 positions shown; findings below may reference images not displayed]

FLUOROSCOPY TIME:  Fluoroscopy Time: 43 seconds

Radiation Exposure Index: 39.91 microGray*m^2

PROCEDURE:
The procedure, risks, benefits, and alternatives were explained to
the patient. Questions regarding the procedure were encouraged and
answered. The patient understands and consents to the procedure.

RIGHT L4-5 FACET INJECTION: A posterior oblique approach was taken
to the facet on the right at L4-5 using a curved 22 gauge spinal
needle. Intra-articular positioning was confirmed by injecting a
small amount of Isovue-M 200, and contrast also spread medial to the
superior aspect of the facet joint into the region of the synovial
cyst on MRI. Aspiration did not yield a significant amount of fluid.
Cyst rupture was next attempted. 1% lidocaine was injected with
progressive pressurization of the joint followed by an abrupt loss
of resistance indicative of either cyst rupture, capsular rupture,
or both. Subsequent fluoroscopic images demonstrated washout of the
contrast which had been in the region of the synovial cyst
suggesting that the cyst may have been ruptured, however additional
contrast injection predominantly spread into the paraspinal soft
tissues lateral to the joint indicating an element of lateral
capsular rupture. 80 mg of Depo-Medrol were then injected into the
facet joint. The procedure was well-tolerated.
IMPRESSION: Right L4-5 facet injection of steroid and attempted synovial cyst
rupture as detailed above.

## 2021-03-03 MED ORDER — IOPAMIDOL (ISOVUE-M 200) INJECTION 41%
1.0000 mL | Freq: Once | INTRAMUSCULAR | Status: AC
  Administered 2021-03-03: 1 mL via EPIDURAL

## 2021-03-03 MED ORDER — METHYLPREDNISOLONE ACETATE 40 MG/ML INJ SUSP (RADIOLOG
80.0000 mg | Freq: Once | INTRAMUSCULAR | Status: AC
  Administered 2021-03-03: 80 mg via EPIDURAL

## 2021-03-03 NOTE — Discharge Instructions (Signed)

## 2021-07-19 ENCOUNTER — Ambulatory Visit: Payer: No Typology Code available for payment source | Admitting: Family

## 2021-08-09 ENCOUNTER — Ambulatory Visit: Payer: No Typology Code available for payment source | Admitting: Family

## 2021-08-13 ENCOUNTER — Ambulatory Visit: Payer: No Typology Code available for payment source | Admitting: Family

## 2021-08-13 NOTE — Progress Notes (Incomplete)
? ?Subjective:  ? ?By signing my name below, I, [name], attest that this documentation has been prepared under the direction and in the presence of Debbrah Alar NP, 08/13/2021 ? ? Patient ID: Whitney King, female    DOB: Sep 25, 1970, 51 y.o.   MRN: [id number] ? ?No chief complaint on file. ? ? ?HPI ?Patient is in today for an office visit ? ?Health Maintenance Due  ?Topic Date Due  ? Hepatitis C Screening  Never done  ? COVID-19 Vaccine (3 - Booster for Pfizer series) 07/03/2019  ? Zoster Vaccines- Shingrix (1 of 2) Never done  ? MAMMOGRAM  06/18/2021  ? ? ?Past Medical History:  ?Diagnosis Date  ? ABNORMAL FINDINGS, ELEVATED BP W/O HTN 09/13/2006  ? Qualifier: Diagnosis of  By: Larose Kells MD, [place] ACNE NEC 09/08/2006  ? Qualifier: Diagnosis of  By: Larose Kells MD, [place] Allergy   ? Anxiety   ? Anxiety and depression 09/08/2006  ? Qualifier: Diagnosis of  By: Larose Kells MD, [place] Arthritis   ? mild   ? Asthma   ? exercise induced, allergies, no problems since allergy shots  ? Bipolar disorder ([place])   ? pt denies   ? GERD (gastroesophageal reflux disease)   ? diet controlled only  ? Headache(784.0)   ? otc meds prn  ? HTN (hypertension) 03/16/2015  ? Hypertension   ? no medications at this time- post pregnancy  ? Hypothyroidism 05/18/2016  ? IBS (irritable bowel syndrome)   ? Mental disorder   ? Migraines   ? OTHER&UNSPECIFIED DISEASES THE ORAL SOFT TISSUES 03/24/2009  ? Qualifier: Diagnosis of  By: Linna Darner MD, Gwyndolyn Saxon    ? PONV (postoperative nausea and vomiting)   ? Positive ANA (antinuclear antibody) 07/26/2017  ? Seasonal allergies   ? SVD (spontaneous vaginal delivery)   ? x 1  ? ? ?Past Surgical History:  ?Procedure Laterality Date  ? COLONOSCOPY    ? COLONOSCOPY W/ POLYPECTOMY    ? DILITATION & CURRETTAGE/HYSTROSCOPY WITH NOVASURE ABLATION N/A 10/17/2012  ? Procedure: DILATATION & CURETTAGE/HYSTEROSCOPY WITH NOVASURE ABLATION;  Surgeon: Daria Pastures, MD;  Location: [facility];  Service: Gynecology;   Laterality: N/A;  ? EYE SURGERY    ? [place] - bilateral eyes  ? LAPAROSCOPIC TUBAL LIGATION Bilateral 07/15/2015  ? Procedure: LAPAROSCOPIC TUBAL LIGATION;  Surgeon: Bobbye Charleston, MD;  Location: [facility];  Service: Gynecology;  Laterality: Bilateral;  FILSHIE CLIPS  ? left leg vein surgery Bilateral   ? Laser  ? left wrist surgery  2012  ? fracture repair  ? LUMBAR LAMINECTOMY/DECOMPRESSION MICRODISCECTOMY N/A 10/28/2015  ? Procedure: LUMBAR 4-5 DECOMPRESSION ;  Surgeon: Phylliss Bob, MD;  Location: [facility];  Service: Orthopedics;  Laterality: N/A;  LUMBAR 4-5 DECOMPRESSION - cyst removed that was pressing on nerve per pt   ? POLYPECTOMY    ? RECTOCELE REPAIR N/A 10/17/2012  ? Procedure: POSTERIOR REPAIR (RECTOCELE);  Surgeon: Daria Pastures, MD;  Location: [facility];  Service: Gynecology;  Laterality: N/A;  ? RIGHT ANKLE SURGERY  1996  ? RECONSTRUCTION  ? WISDOM TOOTH EXTRACTION    ? ? ?Family History  ?Problem Relation Age of Onset  ? Colon cancer Mother   ?     in her 43's   ? Hyperlipidemia Mother   ? Hypertension Mother   ? Colon polyps Mother   ? Atrial fibrillation Mother   ?     ablation  ?  Colon cancer Father 36  ?     79-80 or so   ? Heart disease Father   ? Hypertension Father   ? Hypertension Maternal Grandmother   ? Stroke Paternal Grandfather   ? Esophageal cancer Neg Hx   ? Rectal cancer Neg Hx   ? Stomach cancer Neg Hx   ? ? ?Social History  ? ?Socioeconomic History  ? Marital status: Divorced  ?  Spouse name: Not on file  ? Number of children: 2  ? Years of education: Not on file  ? Highest education level: Not on file  ?Occupational History  ? 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  ?  Comment: [employer]  ?Tobacco Use  ? Smoking status: Former  ?  Packs/day: 0.05  ?  Years: 20.00  ?  Pack years: 1.00  ?  Types: Cigarettes  ?  Quit date: 02/10/2015  ?  Years since quitting: 6.5  ? Smokeless tobacco: Never  ?Substance and Sexual Activity  ? Alcohol use: Yes  ?  Alcohol/week: 2.0 standard drinks  ?  Types: 2 Glasses  of wine per week  ?  Comment: socially  ? Drug use: No  ? Sexual activity: Yes  ?  Birth control/protection: None  ?Other Topics Concern  ? Not on file  ?Social History Narrative  ? Separated  ? 2 children  ? Works as an Engineer, [employer]  ? Enjoys gym, kayak, paddle, boarding, outside activities  ? ?Social Determinants of Health  ? ?Financial Resource Strain: Not on file  ?Food Insecurity: Not on file  ?Transportation Needs: Not on file  ?Physical Activity: Not on file  ?Stress: Not on file  ?Social Connections: Not on file  ?Intimate Partner Violence: Not on file  ? ? ?Outpatient Medications Prior to Visit  ?Medication Sig Dispense Refill  ? estradiol (ESTRACE) 0.5 MG tablet TK 1 T PO QD  4  ? hydrochlorothiazide (HYDRODIURIL) 25 MG tablet TAKE 1 TABLET(25 MG) BY MOUTH DAILY 90 tablet 1  ? lisinopril (ZESTRIL) 20 MG tablet TAKE 1 TABLET(20 MG) BY MOUTH DAILY 90 tablet 1  ? metroNIDAZOLE (METROGEL) 1 % gel metronidazole 1 % topical gel ? APP EXT AA QD    ? potassium chloride (KLOR-CON) 10 MEQ tablet TAKE 2 TABLETS(20 MEQ) BY MOUTH DAILY 180 tablet 1  ? progesterone (PROMETRIUM) 100 MG capsule Take 100 mg by mouth daily.    ? rizatriptan (MAXALT) 10 MG tablet Take 1 tablet (10 mg total) by mouth as needed for migraine. May repeat in 2 hours if needed 10 tablet 5  ? valACYclovir (VALTREX) 1000 MG tablet 2 tabs by mouth at start of cold sore then 2 tabs 12 hours later 20 tablet 1  ? venlafaxine XR (EFFEXOR-XR) 75 MG 24 hr capsule TAKE 1 CAPSULE(75 MG) BY MOUTH DAILY WITH BREAKFAST 90 capsule 1  ? ?No facility-administered medications prior to visit.  ? ? ?Allergies  ?Allergen Reactions  ? Erythromycin Diarrhea  ? ? ?ROS ? ?   ?Objective:  ?  ?Physical Exam ?Constitutional:   ?   General: She is not in acute distress. ?   Appearance: Normal appearance. She is not ill-appearing.  ?HENT:  ?   Head: Normocephalic and atraumatic.  ?   Right Ear: External ear normal.  ?   Left Ear: External ear normal.  ?Eyes:  ?    Extraocular Movements: Extraocular movements intact.  ?   Pupils: Pupils are equal, round, and reactive to light.  ?Cardiovascular:  ?  Rate and Rhythm: Normal rate and regular rhythm.  ?   Heart sounds: Normal heart sounds. No murmur heard. ?  No gallop.  ?Pulmonary:  ?   Effort: Pulmonary effort is normal. No respiratory distress.  ?   Breath sounds: Normal breath sounds. No wheezing or rales.  ?Skin: ?   General: Skin is warm and dry.  ?Neurological:  ?   Mental Status: She is alert and oriented to person, place, and time.  ?Psychiatric:     ?   Mood and Affect: Mood normal.     ?   Behavior: Behavior normal.     ?   Judgment: Judgment normal.  ? ? ?There were no vitals taken for this visit. ?Wt Readings from Last 3 Encounters:  ?01/20/21 152 lb (68.9 kg)  ?03/02/20 168 lb (76.2 kg)  ?02/17/20 168 lb (76.2 kg)  ? ? ?   ?Assessment & Plan:  ? ?Problem List Items Addressed This Visit   ?None ? ? ? ? ?No orders of the defined types were placed in this encounter. ? ? ?I, [name], personally preformed the services described in this documentation.  All medical record entries made by the scribe were at my direction and in my presence.  I have reviewed the chart and discharge instructions (if applicable) and agree that the record reflects my personal performance and is accurate and complete. 08/13/2021 ? ? ?I,Amber Collins,acting as a Education administrator for [employer], NP.,have documented all relevant documentation on the behalf of Whitney Pear, NP,as directed by  Whitney Pear, NP while in the presence of Whitney Pear, NP. ? ? ? ?[employer] ? ?

## 2021-08-17 ENCOUNTER — Encounter: Payer: Self-pay | Admitting: Family

## 2021-08-17 MED ORDER — VENLAFAXINE HCL ER 75 MG PO CP24: ORAL_CAPSULE | ORAL | 1 refills | Status: DC

## 2021-08-23 ENCOUNTER — Ambulatory Visit: Payer: No Typology Code available for payment source | Admitting: Family

## 2021-08-23 VITALS — BP 138/82 | HR 79 | Temp 98.9°F | Resp 16 | Ht 66.0 in | Wt 154.4 lb

## 2021-08-23 DIAGNOSIS — E039 Hypothyroidism, unspecified: Secondary | ICD-10-CM

## 2021-08-23 DIAGNOSIS — F32A Depression, unspecified: Secondary | ICD-10-CM

## 2021-08-23 DIAGNOSIS — Z23 Encounter for immunization: Secondary | ICD-10-CM | POA: Diagnosis not present

## 2021-08-23 DIAGNOSIS — G8929 Other chronic pain: Secondary | ICD-10-CM

## 2021-08-23 DIAGNOSIS — M545 Low back pain, unspecified: Secondary | ICD-10-CM

## 2021-08-23 DIAGNOSIS — Z8619 Personal history of other infectious and parasitic diseases: Secondary | ICD-10-CM

## 2021-08-23 DIAGNOSIS — L719 Rosacea, unspecified: Secondary | ICD-10-CM | POA: Diagnosis not present

## 2021-08-23 DIAGNOSIS — I10 Essential (primary) hypertension: Secondary | ICD-10-CM

## 2021-08-23 DIAGNOSIS — Z1159 Encounter for screening for other viral diseases: Secondary | ICD-10-CM | POA: Diagnosis not present

## 2021-08-23 DIAGNOSIS — F419 Anxiety disorder, unspecified: Secondary | ICD-10-CM

## 2021-08-23 LAB — BASIC METABOLIC PANEL
BUN: 10 mg/dL (ref 6–23)
CO2: 28 mEq/L (ref 19–32)
Calcium: 9.5 mg/dL (ref 8.4–10.5)
Chloride: 102 mEq/L (ref 96–112)
Creatinine, Ser: 0.67 mg/dL (ref 0.40–1.20)
GFR: 101.17 mL/min (ref >60.00)
Glucose, Bld: 99 mg/dL (ref 70–99)
Potassium: 4.1 mEq/L (ref 3.5–5.1)
Sodium: 138 mEq/L (ref 135–145)

## 2021-08-23 LAB — T4, FREE: Free T4: 0.75 ng/dL (ref 0.60–1.60)

## 2021-08-23 LAB — T3, FREE: T3, Free: 3.2 pg/mL (ref 2.3–4.2)

## 2021-08-23 LAB — TSH: TSH: 2.72 u[IU]/mL (ref 0.35–5.50)

## 2021-08-23 MED ORDER — LISINOPRIL 20 MG PO TABS: 20.0000 mg | ORAL_TABLET | Freq: Every day | ORAL | 1 refills | Status: DC

## 2021-08-23 MED ORDER — VENLAFAXINE HCL ER 75 MG PO CP24: ORAL_CAPSULE | ORAL | 1 refills | Status: DC

## 2021-08-23 MED ORDER — VALACYCLOVIR HCL 1 G PO TABS: ORAL_TABLET | ORAL | 1 refills | Status: DC

## 2021-08-23 MED ORDER — HYDROCHLOROTHIAZIDE 25 MG PO TABS: ORAL_TABLET | ORAL | 1 refills | Status: DC

## 2021-08-23 NOTE — Assessment & Plan Note (Signed)
Maintained on metronidazole gel per dermatology.  ?

## 2021-08-23 NOTE — Progress Notes (Addendum)
? ?Subjective:  ? ?By signing my name below, I, Joaquin Music, attest that this documentation has been prepared under the direction and in the presence of Debbrah Alar, NP 08/23/2021  ?  ? ? Patient ID: Whitney King, female    DOB: [birth year], 51 y.o.   MRN: [id number] ? ?No chief complaint on file. ? ? ?HPI ?Patient is in today for an office visit. ? 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 She has been receiving injections and started on gabapentin. She started with 2x a day but has reduced to once a day.  ? ?Anxiety- She is doing well on 75 mg Effexor and is doing well on it. Adds there is still some residual anxiety but is not worried at this time. ? ?Blood pressure- Her blood pressure is elevated at today's visit. She does not check her blood pressure at home. She is taking 20 mg lisinopril and 25 mg HCTZ.  ?BP Readings from Last 3 Encounters:  ?08/23/21 138/82  ?03/03/21 (!) 163/86  ?01/20/21 134/84  ?  ?Immunizations- She will receive the 1st dose of the shingles vaccine.She is interested in the Hepatitis C screening. She has 2 Covid-19 vaccines at this time and is not interested in the booster vaccine. ? ? ?Past Medical History:  ?Diagnosis Date  ? ABNORMAL FINDINGS, ELEVATED BP W/O HTN 09/13/2006  ? Qualifier: Diagnosis of  By: Larose Kells MD, [place] ACNE NEC 09/08/2006  ? Qualifier: Diagnosis of  By: Larose Kells MD, [place] Allergy   ? Anxiety   ? Anxiety and depression 09/08/2006  ? Qualifier: Diagnosis of  By: Larose Kells MD, [place] Arthritis   ? mild   ? Asthma   ? exercise induced, allergies, no problems since allergy shots  ? Bipolar disorder ([place])   ? pt denies   ? GERD (gastroesophageal reflux disease)   ? diet controlled only  ? Headache(784.0)   ? otc meds prn  ? HTN (hypertension) 03/16/2015  ? Hypertension   ? no medications at this time- post pregnancy  ? Hypothyroidism 05/18/2016  ? IBS (irritable bowel syndrome)   ? Mental disorder   ? Migraines   ?  OTHER&UNSPECIFIED DISEASES THE ORAL SOFT TISSUES 03/24/2009  ? Qualifier: Diagnosis of  By: Linna Darner MD, Gwyndolyn Saxon    ? PONV (postoperative nausea and vomiting)   ? Positive ANA (antinuclear antibody) 07/26/2017  ? Seasonal allergies   ? SVD (spontaneous vaginal delivery)   ? x 1  ? ? ?Past Surgical History:  ?Procedure Laterality Date  ? COLONOSCOPY    ? COLONOSCOPY W/ POLYPECTOMY    ? DILITATION & CURRETTAGE/HYSTROSCOPY WITH NOVASURE ABLATION N/A 10/17/2012  ? Procedure: DILATATION & CURETTAGE/HYSTEROSCOPY WITH NOVASURE ABLATION;  Surgeon: Daria Pastures, MD;  Location: [facility];  Service: Gynecology;  Laterality: N/A;  ? EYE SURGERY    ? [place] - bilateral eyes  ? LAPAROSCOPIC TUBAL LIGATION Bilateral 07/15/2015  ? Procedure: LAPAROSCOPIC TUBAL LIGATION;  Surgeon: Bobbye Charleston, MD;  Location: [facility];  Service: Gynecology;  Laterality: Bilateral;  FILSHIE CLIPS  ? left leg vein surgery Bilateral   ? Laser  ? left wrist surgery  2012  ? fracture repair  ? LUMBAR LAMINECTOMY/DECOMPRESSION MICRODISCECTOMY N/A 10/28/2015  ? Procedure: LUMBAR 4-5 DECOMPRESSION ;  Surgeon: Phylliss Bob, MD;  Location: [facility];  Service: Orthopedics;  Laterality: N/A;  LUMBAR 4-5 DECOMPRESSION - cyst removed that was pressing on nerve  per pt   ? POLYPECTOMY    ? RECTOCELE REPAIR N/A 10/17/2012  ? Procedure: POSTERIOR REPAIR (RECTOCELE);  Surgeon: Daria Pastures, MD;  Location: [facility];  Service: Gynecology;  Laterality: N/A;  ? RIGHT ANKLE SURGERY  1996  ? RECONSTRUCTION  ? WISDOM TOOTH EXTRACTION    ? ? ?Family History  ?Problem Relation Age of Onset  ? Colon cancer Mother   ?     in her 25's   ? Hyperlipidemia Mother   ? Hypertension Mother   ? Colon polyps Mother   ? Atrial fibrillation Mother   ?     ablation  ? Colon cancer Father 63  ?     79-80 or so   ? Heart disease Father   ? Hypertension Father   ? Hypertension Maternal Grandmother   ? Stroke Paternal Grandfather   ? Esophageal cancer Neg Hx   ? Rectal cancer Neg Hx   ? Stomach  cancer Neg Hx   ? ? ?Social History  ? ?Socioeconomic History  ? Marital status: Divorced  ?  Spouse name: Not on file  ? Number of children: 2  ? Years of education: Not on file  ? Highest education level: Not on file  ?Occupational History  ? 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  ?  Comment: [employer]  ?Tobacco Use  ? Smoking status: Former  ?  Packs/day: 0.05  ?  Years: 20.00  ?  Pack years: 1.00  ?  Types: Cigarettes  ?  Quit date: 02/10/2015  ?  Years since quitting: 6.5  ? Smokeless tobacco: Never  ?Substance and Sexual Activity  ? Alcohol use: Yes  ?  Alcohol/week: 2.0 standard drinks  ?  Types: 2 Glasses of wine per week  ?  Comment: socially  ? Drug use: No  ? Sexual activity: Yes  ?  Birth control/protection: None  ?Other Topics Concern  ? Not on file  ?Social History Narrative  ? Separated  ? 2 children  ? Works as an Engineer, [employer]  ? Enjoys gym, kayak, paddle, boarding, outside activities  ? ?Social Determinants of Health  ? ?Financial Resource Strain: Not on file  ?Food Insecurity: Not on file  ?Transportation Needs: Not on file  ?Physical Activity: Not on file  ?Stress: Not on file  ?Social Connections: Not on file  ?Intimate Partner Violence: Not on file  ? ? ?Outpatient Medications Prior to Visit  ?Medication Sig Dispense Refill  ? estradiol (ESTRACE) 0.5 MG tablet TK 1 T PO QD  4  ? gabapentin (NEURONTIN) 300 MG capsule Take 300 mg by mouth 3 (three) times daily as needed. Taking once a day    ? metroNIDAZOLE (METROGEL) 1 % gel metronidazole 1 % topical gel ? APP EXT AA QD    ? progesterone (PROMETRIUM) 100 MG capsule Take 100 mg by mouth daily.    ? rizatriptan (MAXALT) 10 MG tablet Take 1 tablet (10 mg total) by mouth as needed for migraine. May repeat in 2 hours if needed 10 tablet 5  ? hydrochlorothiazide (HYDRODIURIL) 25 MG tablet TAKE 1 TABLET(25 MG) BY MOUTH DAILY 90 tablet 1  ? lisinopril (ZESTRIL) 20 MG tablet TAKE 1 TABLET(20 MG) BY MOUTH DAILY 90 tablet 1  ? potassium chloride  (KLOR-CON) 10 MEQ tablet TAKE 2 TABLETS(20 MEQ) BY MOUTH DAILY 180 tablet 1  ? valACYclovir (VALTREX) 1000 MG tablet 2 tabs by mouth at start of cold sore then 2 tabs 12 hours later 20 tablet  1  ? venlafaxine XR (EFFEXOR-XR) 75 MG 24 hr capsule TAKE 1 CAPSULE(75 MG) BY MOUTH DAILY WITH BREAKFAST 90 capsule 1  ? ?No facility-administered medications prior to visit.  ? ? ?Allergies  ?Allergen Reactions  ? Erythromycin Diarrhea  ? ? ?Review of Systems  ?Musculoskeletal:  Negative for back pain.  ?Psychiatric/Behavioral:  Negative for depression. The patient is not nervous/anxious.   ? ?   ?Objective:  ?  ?Physical Exam ?Constitutional:   ?   General: She is not in acute distress. ?   Appearance: Normal appearance. She is not ill-appearing.  ?HENT:  ?   Head: Normocephalic and atraumatic.  ?   Right Ear: External ear normal.  ?   Left Ear: External ear normal.  ?Neck:  ?   Thyroid: No thyroid mass or thyromegaly.  ?Cardiovascular:  ?   Rate and Rhythm: Normal rate and regular rhythm.  ?   Pulses: Normal pulses.  ?   Heart sounds: Normal heart sounds. No murmur heard. ?Pulmonary:  ?   Effort: Pulmonary effort is normal. No respiratory distress.  ?   Breath sounds: Normal breath sounds. No wheezing or rhonchi.  ?Skin: ?   General: Skin is warm and dry.  ?Neurological:  ?   Mental Status: She is alert and oriented to person, place, and time.  ?Psychiatric:     ?   Behavior: Behavior normal.     ?   Judgment: Judgment normal.  ? ? ?BP 138/82 (BP Location: Left Arm, Patient Position: Sitting, Cuff Size: Small)   Pulse 79   Temp 98.9 ?F (37.2 ?C) (Oral)   Resp 16   Ht '5\' 6"'$  (1.676 m)   Wt 154 lb 6.4 oz (70 kg)   SpO2 100%   BMI 24.92 kg/m?  ?Wt Readings from Last 3 Encounters:  ?08/23/21 154 lb 6.4 oz (70 kg)  ?01/20/21 152 lb (68.9 kg)  ?03/02/20 168 lb (76.2 kg)  ? ? ?   ?Assessment & Plan:  ? ?Problem List Items Addressed This Visit   ? ?  ? Unprioritized  ? Rosacea  ?  Maintained on metronidazole gel per  dermatology.  ? ?  ?  ? Hypothyroidism - Primary  ?  Clinically stable. Not on synthroid. Check follow up TFT's. ? ?  ?  ? Relevant Orders  ? TSH  ? T3, free  ? T4, free  ? Hx of cold sores  ?  Stable. Requests refill on prn Valtrex.  ? ?  ?

## 2021-08-23 NOTE — Assessment & Plan Note (Signed)
Clinically stable. Not on synthroid. Check follow up TFT's. ?

## 2021-08-23 NOTE — Assessment & Plan Note (Signed)
Exacerbated by ganglion cyst. She is managing with gabapentin for pain control and does not plan to pursue surgery which she states would be a fusion.  ?

## 2021-08-23 NOTE — Assessment & Plan Note (Addendum)
BP Readings from Last 3 Encounters:  ?08/23/21 138/82  ?03/03/21 (!) 163/86  ?01/20/21 134/84  ? ?Initial BP was high but repeat bp is WNL.  Continue hctz.  Monitor.  ? ?

## 2021-08-23 NOTE — Assessment & Plan Note (Signed)
Stable. Requests refill on prn Valtrex.  ?

## 2021-08-23 NOTE — Assessment & Plan Note (Signed)
Stable on effexor, continue same. 

## 2021-08-24 LAB — HEPATITIS C ANTIBODY
Hepatitis C Ab: NONREACTIVE
SIGNAL TO CUT-OFF: 0.03 (ref <1.00)

## 2022-02-07 ENCOUNTER — Encounter: Payer: Self-pay | Admitting: Family

## 2022-02-07 ENCOUNTER — Encounter: Payer: No Typology Code available for payment source | Admitting: Family

## 2022-02-07 ENCOUNTER — Ambulatory Visit (INDEPENDENT_AMBULATORY_CARE_PROVIDER_SITE_OTHER): Payer: No Typology Code available for payment source | Admitting: Family

## 2022-02-07 VITALS — BP 126/74 | HR 84 | Temp 98.4°F | Resp 16 | Ht 65.3 in | Wt 158.0 lb

## 2022-02-07 DIAGNOSIS — I10 Essential (primary) hypertension: Secondary | ICD-10-CM

## 2022-02-07 DIAGNOSIS — Z23 Encounter for immunization: Secondary | ICD-10-CM | POA: Diagnosis not present

## 2022-02-07 DIAGNOSIS — Z0000 Encounter for general adult medical examination without abnormal findings: Secondary | ICD-10-CM

## 2022-02-07 LAB — BASIC METABOLIC PANEL
BUN: 15 mg/dL (ref 6–23)
CO2: 27 mEq/L (ref 19–32)
Calcium: 9.6 mg/dL (ref 8.4–10.5)
Chloride: 104 mEq/L (ref 96–112)
Creatinine, Ser: 0.7 mg/dL (ref 0.40–1.20)
GFR: 99.78 mL/min (ref >60.00)
Glucose, Bld: 88 mg/dL (ref 70–99)
Potassium: 4.5 mEq/L (ref 3.5–5.1)
Sodium: 140 mEq/L (ref 135–145)

## 2022-02-07 MED ORDER — VENLAFAXINE HCL ER 75 MG PO CP24: ORAL_CAPSULE | ORAL | 1 refills | Status: DC

## 2022-02-07 MED ORDER — LISINOPRIL 20 MG PO TABS: 20.0000 mg | ORAL_TABLET | Freq: Every day | ORAL | 1 refills | Status: DC

## 2022-02-07 MED ORDER — HYDROCHLOROTHIAZIDE 25 MG PO TABS: ORAL_TABLET | ORAL | 1 refills | Status: DC

## 2022-02-07 NOTE — Assessment & Plan Note (Signed)
Continue healthy diet and regular exercise. She will update mammogram with GYN.  Pap up to date, colo up to date. Shingrix #2 today. Flu shot up to date, declines covid booster.

## 2022-02-07 NOTE — Addendum Note (Signed)
Addended by: Jiles Prows on: 02/07/2022 01:28 PM   Modules accepted: Orders

## 2022-02-07 NOTE — Progress Notes (Signed)
Subjective:     Patient ID: Whitney King, female    DOB: [birth year], 51 y.o.   MRN: [id number]  Chief Complaint  Patient presents with   Annual Exam    HPI  Patient presents today for complete physical.  Immunizations: due for shingrix #2. Declines covid booster. Tetanus up to date.  Diet: diet is healthy Exercise:  regular Colonoscopy: 11.21 due 11/26 Pap Smear: 06/18/20 Wt Readings from Last 3 Encounters:  02/07/22 158 lb (71.7 kg)  08/23/21 154 lb 6.4 oz (70 kg)  01/20/21 152 lb (68.9 kg)    Mammogram: due, she will complete with GYN Vision: up to date Dental: up to date   Health Maintenance Due  Topic Date Due   MAMMOGRAM  06/18/2021   Zoster Vaccines- Shingrix (2 of 2) 10/18/2021    Past Medical History:  Diagnosis Date   ABNORMAL FINDINGS, ELEVATED BP W/O HTN 09/13/2006   Qualifier: Diagnosis of  By: Larose Kells MD, [place] 09/08/2006   Qualifier: Diagnosis of  By: Larose Kells MD, [place]    Allergy    Anxiety    Anxiety and depression 09/08/2006   Qualifier: Diagnosis of  By: Larose Kells MD, [place]    Arthritis    mild    Asthma    exercise induced, allergies, no problems since allergy shots   Bipolar disorder ([place])    pt denies    GERD (gastroesophageal reflux disease)    diet controlled only   Headache(784.0)    otc meds prn   HTN (hypertension) 03/16/2015   Hypertension    no medications at this time- post pregnancy   Hypothyroidism 05/18/2016   IBS (irritable bowel syndrome)    Mental disorder    Migraines    OTHER&UNSPECIFIED DISEASES THE ORAL SOFT TISSUES 03/24/2009   Qualifier: Diagnosis of  By: Linna Darner MD, [place]     PONV (postoperative nausea and vomiting)    Positive ANA (antinuclear antibody) 07/26/2017   Seasonal allergies    SVD (spontaneous vaginal delivery)    x 1    Past Surgical History:  Procedure Laterality Date   COLONOSCOPY     COLONOSCOPY W/ POLYPECTOMY     DILITATION & CURRETTAGE/HYSTROSCOPY WITH NOVASURE ABLATION N/A  10/17/2012   Procedure: DILATATION & CURETTAGE/HYSTEROSCOPY WITH NOVASURE ABLATION;  Surgeon: Daria Pastures, MD;  Location: [facility];  Service: Gynecology;  Laterality: N/A;   EYE SURGERY     PRK - bilateral eyes   LAPAROSCOPIC TUBAL LIGATION Bilateral 07/15/2015   Procedure: LAPAROSCOPIC TUBAL LIGATION;  Surgeon: Bobbye Charleston, MD;  Location: [facility];  Service: Gynecology;  Laterality: Bilateral;  FILSHIE CLIPS   left leg vein surgery Bilateral    Laser   left wrist surgery  2012   fracture repair   LUMBAR LAMINECTOMY/DECOMPRESSION MICRODISCECTOMY N/A 10/28/2015   Procedure: LUMBAR 4-5 DECOMPRESSION ;  Surgeon: Phylliss Bob, MD;  Location: [facility];  Service: Orthopedics;  Laterality: N/A;  LUMBAR 4-5 DECOMPRESSION - cyst removed that was pressing on nerve per pt    POLYPECTOMY     RECTOCELE REPAIR N/A 10/17/2012   Procedure: POSTERIOR REPAIR (RECTOCELE);  Surgeon: Daria Pastures, MD;  Location: [facility];  Service: Gynecology;  Laterality: N/A;   RIGHT ANKLE SURGERY  1996   RECONSTRUCTION   WISDOM TOOTH EXTRACTION      Family History  Problem Relation Age of Onset   Colon cancer Mother        in her 52's  Hyperlipidemia Mother    Hypertension Mother    Colon polyps Mother    Atrial fibrillation Mother        ablation   Colon cancer Father 39       79-80 or so    Heart disease Father    Hypertension Father    Hypertension Maternal Grandmother    Stroke Paternal Grandfather    Esophageal cancer Neg Hx    Rectal cancer Neg Hx    Stomach cancer Neg Hx     Social History   Socioeconomic History   Marital status: Divorced    Spouse name: Not on file   Number of children: 2   Years of education: Not on file   Highest education level: Not on file  Occupational History   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    Comment: [employer]  Tobacco Use   Smoking status: Former    Packs/day: 0.05    Years: 20.00    Total pack years: 1.00    Types: Cigarettes    Quit date: 02/10/2015     Years since quitting: 6.9   Smokeless tobacco: Never  Substance and Sexual Activity   Alcohol use: Yes    Alcohol/week: 2.0 standard drinks of alcohol    Types: 2 Glasses of wine per week    Comment: socially   Drug use: No   Sexual activity: Yes    Birth control/protection: None  Other Topics Concern   Not on file  Social History Narrative   Separated   2 children   Works as an Engineer, structural PA   Enjoys gym, kayak, paddle, boarding, outside activities   Social Determinants of Radio broadcast assistant Strain: Not on file  Food Insecurity: Not on file  Transportation Needs: Not on file  Physical Activity: Not on file  Stress: Not on file  Social Connections: Not on file  Intimate Partner Violence: Not on file    Outpatient Medications Prior to Visit  Medication Sig Dispense Refill   estradiol (ESTRACE) 0.5 MG tablet TK 1 T PO QD  4   gabapentin (NEURONTIN) 300 MG capsule Take 300 mg by mouth 3 (three) times daily as needed. Taking once a day     metroNIDAZOLE (METROGEL) 1 % gel metronidazole 1 % topical gel  APP EXT AA QD     progesterone (PROMETRIUM) 100 MG capsule Take 100 mg by mouth daily.     rizatriptan (MAXALT) 10 MG tablet Take 1 tablet (10 mg total) by mouth as needed for migraine. May repeat in 2 hours if needed 10 tablet 5   valACYclovir (VALTREX) 1000 MG tablet 2 tabs by mouth at start of cold sore then 2 tabs 12 hours later 20 tablet 1   hydrochlorothiazide (HYDRODIURIL) 25 MG tablet TAKE 1 TABLET(25 MG) BY MOUTH DAILY 90 tablet 1   lisinopril (ZESTRIL) 20 MG tablet Take 1 tablet (20 mg total) by mouth daily. 90 tablet 1   venlafaxine XR (EFFEXOR-XR) 75 MG 24 hr capsule TAKE 1 CAPSULE(75 MG) BY MOUTH DAILY WITH BREAKFAST 90 capsule 1   No facility-administered medications prior to visit.    Allergies  Allergen Reactions   Erythromycin Diarrhea    Review of Systems  Constitutional:  Negative for weight loss.  HENT:  Negative for congestion and  hearing loss.   Eyes:  Negative for blurred vision.  Respiratory:  Negative for cough.   Cardiovascular:  Negative for leg swelling.  Gastrointestinal:  Positive for constipation and diarrhea (ibs-  alternates). Negative for blood in stool and melena.  Genitourinary:  Negative for dysuria, frequency and hematuria.  Musculoskeletal:  Negative for back pain, joint pain and myalgias.  Skin:  Negative for rash.  Neurological:  Negative for headaches.  Psychiatric/Behavioral:         Denies depression/anxiety       Objective:    Physical Exam  BP 126/74 (BP Location: Right Arm, Patient Position: Sitting, Cuff Size: Small)   Pulse 84   Temp 98.4 F (36.9 C) (Oral)   Resp 16   Ht 5' 5.3" (1.659 m)   Wt 158 lb (71.7 kg)   SpO2 100%   BMI 26.05 kg/m  Wt Readings from Last 3 Encounters:  02/07/22 158 lb (71.7 kg)  08/23/21 154 lb 6.4 oz (70 kg)  01/20/21 152 lb (68.9 kg)   Physical Exam  Constitutional: She is oriented to person, place, and time. She appears well-developed and well-nourished. No distress.  HENT:  Head: Normocephalic and atraumatic.  Right Ear: Tympanic membrane and ear canal normal.  Left Ear: Tympanic membrane and ear canal normal.  Mouth/Throat: Oropharynx is clear and moist.  Eyes: Pupils are equal, round, and reactive to light. No scleral icterus.  Neck: Normal range of motion. No thyromegaly present.  Cardiovascular: Normal rate and regular rhythm.   No murmur heard. Pulmonary/Chest: Effort normal and breath sounds normal. No respiratory distress. He has no wheezes. She has no rales. She exhibits no tenderness.  Abdominal: Soft. Bowel sounds are normal. She exhibits no distension and no mass. There is no tenderness. There is no rebound and no guarding.  Musculoskeletal: She exhibits no edema.  Lymphadenopathy:    She has no cervical adenopathy.  Neurological: She is alert and oriented to person, place, and time. She has 3+ patellar reflexes. She exhibits  normal muscle tone. Coordination normal.  Skin: Skin is warm and dry.  Psychiatric: She has a normal mood and affect. Her behavior is normal. Judgment and thought content normal.  Breast/pelvic: deferred         Assessment & Plan:       Assessment & Plan:   Problem List Items Addressed This Visit       Unprioritized   Preventative health care - Primary    Continue healthy diet and regular exercise. She will update mammogram with GYN.  Pap up to date, colo up to date. Shingrix #2 today. Flu shot up to date, declines covid booster.       HTN (hypertension)   Relevant Medications   lisinopril (ZESTRIL) 20 MG tablet   hydrochlorothiazide (HYDRODIURIL) 25 MG tablet   Other Relevant Orders   Basic metabolic panel    I am having Whitney King maintain her estradiol, progesterone, metroNIDAZOLE, rizatriptan, gabapentin, valACYclovir, lisinopril, hydrochlorothiazide, and venlafaxine XR.  Meds ordered this encounter  Medications   lisinopril (ZESTRIL) 20 MG tablet    Sig: Take 1 tablet (20 mg total) by mouth daily.    Dispense:  90 tablet    Refill:  1    Order Specific Question:   Supervising Provider    Answer:   Penni Homans A [[id number]]   hydrochlorothiazide (HYDRODIURIL) 25 MG tablet    Sig: TAKE 1 TABLET(25 MG) BY MOUTH DAILY    Dispense:  90 tablet    Refill:  1    Order Specific Question:   Supervising Provider    Answer:   Penni Homans A [[id number]]   venlafaxine XR (EFFEXOR-XR) 75 MG 24 hr  capsule    Sig: TAKE 1 CAPSULE(75 MG) BY MOUTH DAILY WITH BREAKFAST    Dispense:  90 capsule    Refill:  1    Order Specific Question:   Supervising Provider    Answer:   Penni Homans A [[id number]]

## 2022-06-12 ENCOUNTER — Encounter: Payer: Self-pay | Admitting: Family

## 2022-06-13 ENCOUNTER — Other Ambulatory Visit (INDEPENDENT_AMBULATORY_CARE_PROVIDER_SITE_OTHER): Payer: No Typology Code available for payment source | Admitting: Family

## 2022-06-13 ENCOUNTER — Other Ambulatory Visit: Payer: Self-pay | Admitting: Family

## 2022-06-13 DIAGNOSIS — T783XXA Angioneurotic edema, initial encounter: Secondary | ICD-10-CM

## 2022-06-13 DIAGNOSIS — I10 Essential (primary) hypertension: Secondary | ICD-10-CM

## 2022-06-13 MED ORDER — EPINEPHRINE 0.3 MG/0.3ML IJ SOAJ: 0.3000 mg | INTRAMUSCULAR | 0 refills | Status: DC | PRN

## 2022-06-13 MED ORDER — METOPROLOL SUCCINATE ER 50 MG PO TB24: 50.0000 mg | ORAL_TABLET | Freq: Every day | ORAL | 0 refills | Status: DC

## 2022-06-13 NOTE — Progress Notes (Signed)
Please see the MyChart message reply(ies) for my assessment and plan.  The patient gave consent for this Medical Advice Message and is aware that it may result in a bill to their insurance company as well as the possibility that this may result in a co-payment or deductible. They are an established patient, but are not seeking medical advice exclusively about a problem treated during an in person or video visit in the last 7 days. I did not recommend an in person or video visit within 7 days of my reply.  I spent a total of 10 minutes cumulative time within 7 days through [place], NP

## 2022-06-14 ENCOUNTER — Other Ambulatory Visit: Payer: Self-pay | Admitting: Family

## 2022-06-14 MED ORDER — HYDROXYZINE PAMOATE 25 MG PO CAPS: 25.0000 mg | ORAL_CAPSULE | Freq: Three times a day (TID) | ORAL | 0 refills | Status: AC | PRN

## 2022-06-16 ENCOUNTER — Ambulatory Visit: Payer: No Typology Code available for payment source | Admitting: Psychology

## 2022-06-16 DIAGNOSIS — F4321 Adjustment disorder with depressed mood: Secondary | ICD-10-CM | POA: Diagnosis not present

## 2022-06-16 NOTE — Progress Notes (Signed)
[facility] Counselor Initial Adult Exam  Name: Whitney King Date: 06/16/2022 MRN: [id number] DOB: [birth year] PCP: Debbrah Alar, NP  Time spent: 3:00pm-3:55pm   55 minutes  Guardian/Payee:  n/a    Paperwork requested: No   Reason for Visit /Presenting Problem: Pt present for face-to-face initial assessment via video Webex.  Pt consents to telehealth video session due to COVID 19 pandemic. Location of pt: home Location of therapist: home office.  Pt states that 2 and 1/2 years ago she left her boyfriend RJ and moved out and got her own place.   Pt felt the end of that relationship was good for her even though it was difficult.   Pt met a man named Will and they have been dating for two years and broke up recently bc he did not want marriage.  The breakup was just last week.   Pt is grieving the loss of the relationship.   Pt feels like she compromises way too much in relationships and wants to learn how to not do that.   Pt wants to learn some tools to be able to be ok being alone.  Pt is fearful about being alone bc she gets into a lot of "what if " thinking.  Pt is looking to buy a house and she has a good support group of friends.    Mental Status Exam: Appearance:   Casual     Behavior:  Appropriate  Motor:  Normal  Speech/Language:   Normal Rate  Affect:  Appropriate  Mood:  normal  Thought process:  normal  Thought content:    WNL  Sensory/Perceptual disturbances:    WNL  Orientation:  oriented to person, place, time/date, and situation  Attention:  Good  Concentration:  Good  Memory:  WNL  Fund of knowledge:   Good  Insight:    Good  Judgment:   Good  Impulse Control:  Good    Reported Symptoms:  sadness, grief  Risk Assessment: Danger to Self:  No Self-injurious Behavior: No Danger to Others: No Duty to Warn:no Physical Aggression / Violence:No  Access to Firearms a concern: No  Gang Involvement:No  Patient / guardian was educated  about steps to take if suicide or homicide risk level increases between visits: n/a While future psychiatric events cannot be accurately predicted, the patient does not currently require acute inpatient psychiatric care and does not currently meet [facility] involuntary commitment criteria.  Substance Abuse History: Current substance abuse: No     Past Psychiatric History:   Previous psychological history is significant for depression Outpatient Providers:pt has been in therapy in the past. History of Psych Hospitalization: No  Psychological Testing:  n/a    Abuse History:  Victim of: No.,  n/a    Report needed: No. Victim of Neglect:No. Perpetrator of  n/a   Witness / Exposure to Domestic Violence: No   Protective Services Involvement: No  Witness to [employer] Violence:  No   Family History:  Family History  Problem Relation Age of Onset   Colon cancer Mother        in her 66's    Hyperlipidemia Mother    Hypertension Mother    Colon polyps Mother    Atrial fibrillation Mother        ablation   Colon cancer Father 94       79-80 or so    Heart disease Father    Hypertension Father    Hypertension Maternal Grandmother  Stroke Paternal Grandfather    Esophageal cancer Neg Hx    Rectal cancer Neg Hx    Stomach cancer Neg Hx     Living situation: the patient lives alone  Sexual Orientation: Straight  Relationship Status: divorced  Name of spouse / other:n/a If a parent, number of children / ages:pt has two daughters  Support Systems: friends  Financial Stress:  No   Income/Employment/Disability: Employment  Armed forces logistics/support/administrative officer: No   Educational History: Education: Scientist, product/process development: Protestant  Any cultural differences that may affect / interfere with treatment:  not applicable   Recreation/Hobbies: hiking, reading  Stressors: Loss of boyfriend through breakup.     Strengths: Supportive Relationships, Hopefulness,  Self Advocate, and Able to Communicate Effectively  Barriers:  none   Legal History: Pending legal issue / charges: The patient has no significant history of legal issues. History of legal issue / charges:  n/a  Medical History/Surgical History: reviewed Past Medical History:  Diagnosis Date   ABNORMAL FINDINGS, ELEVATED BP W/O HTN 09/13/2006   Qualifier: Diagnosis of  By: Larose Kells MD, [place] 09/08/2006   Qualifier: Diagnosis of  By: Larose Kells MD, [place]    Allergy    Anxiety    Anxiety and depression 09/08/2006   Qualifier: Diagnosis of  By: Larose Kells MD, [place]    Arthritis    mild    Asthma    exercise induced, allergies, no problems since allergy shots   Bipolar disorder ([place])    pt denies    GERD (gastroesophageal reflux disease)    diet controlled only   Headache(784.0)    otc meds prn   HTN (hypertension) 03/16/2015   Hypertension    no medications at this time- post pregnancy   Hypothyroidism 05/18/2016   IBS (irritable bowel syndrome)    Mental disorder    Migraines    OTHER&UNSPECIFIED DISEASES THE ORAL SOFT TISSUES 03/24/2009   Qualifier: Diagnosis of  By: Linna Darner MD, [place]     PONV (postoperative nausea and vomiting)    Positive ANA (antinuclear antibody) 07/26/2017   Seasonal allergies    SVD (spontaneous vaginal delivery)    x 1    Past Surgical History:  Procedure Laterality Date   COLONOSCOPY     COLONOSCOPY W/ POLYPECTOMY     DILITATION & CURRETTAGE/HYSTROSCOPY WITH NOVASURE ABLATION N/A 10/17/2012   Procedure: DILATATION & CURETTAGE/HYSTEROSCOPY WITH NOVASURE ABLATION;  Surgeon: Daria Pastures, MD;  Location: [facility];  Service: Gynecology;  Laterality: N/A;   EYE SURGERY     PRK - bilateral eyes   LAPAROSCOPIC TUBAL LIGATION Bilateral 07/15/2015   Procedure: LAPAROSCOPIC TUBAL LIGATION;  Surgeon: Bobbye Charleston, MD;  Location: [facility];  Service: Gynecology;  Laterality: Bilateral;  FILSHIE CLIPS   left leg vein surgery Bilateral    Laser   left  wrist surgery  2012   fracture repair   LUMBAR LAMINECTOMY/DECOMPRESSION MICRODISCECTOMY N/A 10/28/2015   Procedure: LUMBAR 4-5 DECOMPRESSION ;  Surgeon: Phylliss Bob, MD;  Location: [facility];  Service: Orthopedics;  Laterality: N/A;  LUMBAR 4-5 DECOMPRESSION - cyst removed that was pressing on nerve per pt    POLYPECTOMY     RECTOCELE REPAIR N/A 10/17/2012   Procedure: POSTERIOR REPAIR (RECTOCELE);  Surgeon: Daria Pastures, MD;  Location: [facility];  Service: Gynecology;  Laterality: N/A;   RIGHT ANKLE SURGERY  1996   RECONSTRUCTION   WISDOM TOOTH EXTRACTION      Medications: Current  Outpatient Medications  Medication Sig Dispense Refill   EPINEPHrine (EPIPEN 2-PAK) 0.3 mg/0.3 mL IJ SOAJ injection Inject 0.3 mg into the muscle as needed for anaphylaxis. 2 each 0   estradiol (ESTRACE) 0.5 MG tablet TK 1 T PO QD  4   gabapentin (NEURONTIN) 300 MG capsule Take 300 mg by mouth 3 (three) times daily as needed. Taking once a day     hydrochlorothiazide (HYDRODIURIL) 25 MG tablet TAKE 1 TABLET(25 MG) BY MOUTH DAILY 90 tablet 1   hydrOXYzine (VISTARIL) 25 MG capsule Take 1 capsule (25 mg total) by mouth every 8 (eight) hours as needed. 30 capsule 0   metoprolol succinate (TOPROL-XL) 50 MG 24 hr tablet Take 1 tablet (50 mg total) by mouth daily. Take with or immediately following a meal. 30 tablet 0   metroNIDAZOLE (METROGEL) 1 % gel metronidazole 1 % topical gel  APP EXT AA QD     progesterone (PROMETRIUM) 100 MG capsule Take 100 mg by mouth daily.     rizatriptan (MAXALT) 10 MG tablet Take 1 tablet (10 mg total) by mouth as needed for migraine. May repeat in 2 hours if needed 10 tablet 5   valACYclovir (VALTREX) 1000 MG tablet 2 tabs by mouth at start of cold sore then 2 tabs 12 hours later 20 tablet 1   venlafaxine XR (EFFEXOR-XR) 75 MG 24 hr capsule TAKE 1 CAPSULE(75 MG) BY MOUTH DAILY WITH BREAKFAST 90 capsule 1   No current facility-administered medications for this visit.    Allergies   Allergen Reactions   Lisinopril     Lip swelling   Erythromycin Diarrhea    Diagnoses:  F43.21  Plan of Care: Recommend ongoing therapy.   Pt participated in setting treatment goals.   Pt wants to improve coping skills.  Plan to meet every two weeks.   Treatment Plan Client Abilities/Strengths  Pt is bright, engaging, and motivated for therapy.   Client Treatment Preferences  Individual therapy.  Client Statement of Needs  Improve coping skills.  Symptoms  Depressed or irritable mood. Unresolved grief issues.  Problems Addressed  Unipolar Depression Goals 1. Alleviate depressive symptoms and return to previous level of effective functioning. 2. Appropriately grieve the loss in order to normalize mood and to return to previously adaptive level of functioning. Objective Learn and implement behavioral strategies to overcome depression. Target Date: 2023-06-17 Frequency: Biweekly  Progress: 10 Modality: individual  Related Interventions Engage the client in "behavioral activation," increasing his/her activity level and contact with sources of reward, while identifying processes that inhibit activation.  Use behavioral techniques such as instruction, rehearsal, role-playing, role reversal, as needed, to facilitate activity in the client's daily life; reinforce success. Assist the client in developing skills that increase the likelihood of deriving pleasure from behavioral activation (e.g., assertiveness skills, developing an exercise plan, less internal/more external focus, increased social involvement); reinforce success. Objective Identify important people in life, past and present, and describe the quality, good and poor, of those relationships. Target Date: 2023-06-17 Frequency: Biweekly  Progress: 10 Modality: individual  Related Interventions Conduct Interpersonal Therapy beginning with the assessment of the client's "interpersonal inventory" of important past and present  relationships; develop a case formulation linking depression to grief, interpersonal role disputes, role transitions, and/or interpersonal deficits). Objective Learn and implement problem-solving and decision-making skills. Target Date: 2023-06-17 Frequency: Biweekly  Progress: 10 Modality: individual  Related Interventions Conduct Problem-Solving Therapy using techniques such as psychoeducation, modeling, and role-playing to teach client problem-solving skills (i.e., defining  a problem specifically, generating possible solutions, evaluating the pros and cons of each solution, selecting and implementing a plan of action, evaluating the efficacy of the plan, accepting or revising the plan); role-play application of the problem-solving skill to a real life issue. Encourage in the client the development of a positive problem orientation in which problems and solving them are viewed as a natural part of life and not something to be feared, despaired, or avoided. 3. Develop healthy interpersonal relationships that lead to the alleviation and help prevent the relapse of depression. 4. Develop healthy thinking patterns and beliefs about self, others, and the world that lead to the alleviation and help prevent the relapse of depression. 5. Recognize, accept, and cope with feelings of depression. Diagnosis F43.21 Conditions For Discharge Achievement of treatment goals and objectives     Clint Bolder, LCSW

## 2022-06-24 ENCOUNTER — Ambulatory Visit (INDEPENDENT_AMBULATORY_CARE_PROVIDER_SITE_OTHER): Payer: No Typology Code available for payment source | Admitting: Family

## 2022-06-24 VITALS — BP 142/78 | HR 78 | Temp 98.2°F | Resp 16 | Wt 153.0 lb

## 2022-06-24 DIAGNOSIS — I10 Essential (primary) hypertension: Secondary | ICD-10-CM

## 2022-06-24 DIAGNOSIS — F419 Anxiety disorder, unspecified: Secondary | ICD-10-CM

## 2022-06-24 DIAGNOSIS — T783XXA Angioneurotic edema, initial encounter: Secondary | ICD-10-CM | POA: Diagnosis not present

## 2022-06-24 DIAGNOSIS — F32A Depression, unspecified: Secondary | ICD-10-CM

## 2022-06-24 NOTE — Assessment & Plan Note (Signed)
No recurrent symptoms. Now has epi-pen on hand.  Remain off of ACE inhibitors. She has not heard back from the allergy referral re: scheduling and I advised her to give their office a call.

## 2022-06-24 NOTE — Assessment & Plan Note (Signed)
Initial reading quite high, repeat reading was almost at goal. Will continue metoprolol at current dose along with hctz.  Pt is advised to check her bp at home and HR a few times and send me her readings via mychart. If SBP >140 at home would consider increasing metoprolol dose.

## 2022-06-24 NOTE — Assessment & Plan Note (Signed)
Having a great deal of situational stress. Mother is moving out of her childhood home, her offer on a house was accepted today, daughter is a senior so she is looking at empty nest and she recently broke up with her boyfriend of 2 years which is very upsetting. She notes that her insomnia has improved with the addition of HS hydroxyzine, thought she wakes back up at 3:30 AM and lays awake for 1 hour. She continues effexor and is working with her therapist.  Hopefully as her stressors calm down she will feel better and she will begin sleeping better.

## 2022-06-24 NOTE — Progress Notes (Signed)
Subjective:   By signing my name below, I, Shehryar Baig, attest that this documentation has been prepared under the direction and in the presence of Debbrah Alar, NP. 06/24/2022   Patient ID: Whitney King, female    DOB: [birth year], 52 y.o.   MRN: [id number]  Chief Complaint  Patient presents with   Hypertension    Here for follow up, medication changed due to allergic reaction   Insomnia    "A little better with medication". Still having trouble staying sleep    Hypertension  Insomnia   Patient is in today for a follow up visit.   Allergy: She stopped taking lisinopril due to developing swollen lips while taking it. She has a epi-pen at this time. The allergy specialist did not reach out for a follow up appointment. She switched to taking 50 mg metoprolol succinate daily PO from lisinopril and reports no new issues while taking it.   Blood pressure: Her blood pressure is elevated during this visit. She notes having increased daily stress and thinks that may be contributing to her blood pressure. She continues taking 50 mg metoprolol succinate daily PO, 25 mg hydrochlorothiazide daily PO and reports no new issues while taking them.  BP Readings from Last 3 Encounters:  06/24/22 (!) 142/78  02/07/22 126/74  08/23/21 138/82   Pulse Readings from Last 3 Encounters:  06/24/22 78  02/07/22 84  08/23/21 79   Sleep: She continues taking 25 mg hydroxyzine for sleep and reports her sleep has improved but she wakes up early and can't go back to sleep after.    Past Medical History:  Diagnosis Date   ABNORMAL FINDINGS, ELEVATED BP W/O HTN 09/13/2006   Qualifier: Diagnosis of  By: Larose Kells MD, [place] 09/08/2006   Qualifier: Diagnosis of  By: Larose Kells MD, [place]    Allergy    Anxiety    Anxiety and depression 09/08/2006   Qualifier: Diagnosis of  By: Larose Kells MD, [place]    Arthritis    mild    Asthma    exercise induced, allergies, no problems since allergy shots    Bipolar disorder ([facility])    pt denies    GERD (gastroesophageal reflux disease)    diet controlled only   Headache(784.0)    otc meds prn   HTN (hypertension) 03/16/2015   Hypertension    no medications at this time- post pregnancy   Hypothyroidism 05/18/2016   IBS (irritable bowel syndrome)    Mental disorder    Migraines    OTHER&UNSPECIFIED DISEASES THE ORAL SOFT TISSUES 03/24/2009   Qualifier: Diagnosis of  By: Linna Darner MD, [place]     PONV (postoperative nausea and vomiting)    Positive ANA (antinuclear antibody) 07/26/2017   Seasonal allergies    SVD (spontaneous vaginal delivery)    x 1    Past Surgical History:  Procedure Laterality Date   COLONOSCOPY     COLONOSCOPY W/ POLYPECTOMY     DILITATION & CURRETTAGE/HYSTROSCOPY WITH NOVASURE ABLATION N/A 10/17/2012   Procedure: DILATATION & CURETTAGE/HYSTEROSCOPY WITH NOVASURE ABLATION;  Surgeon: Daria Pastures, MD;  Location: [facility];  Service: Gynecology;  Laterality: N/A;   EYE SURGERY     PRK - bilateral eyes   LAPAROSCOPIC TUBAL LIGATION Bilateral 07/15/2015   Procedure: LAPAROSCOPIC TUBAL LIGATION;  Surgeon: Bobbye Charleston, MD;  Location: [facility];  Service: Gynecology;  Laterality: Bilateral;  FILSHIE CLIPS   left leg vein surgery Bilateral  Laser   left wrist surgery  2012   fracture repair   LUMBAR LAMINECTOMY/DECOMPRESSION MICRODISCECTOMY N/A 10/28/2015   Procedure: LUMBAR 4-5 DECOMPRESSION ;  Surgeon: Phylliss Bob, MD;  Location: [facility];  Service: Orthopedics;  Laterality: N/A;  LUMBAR 4-5 DECOMPRESSION - cyst removed that was pressing on nerve per pt    POLYPECTOMY     RECTOCELE REPAIR N/A 10/17/2012   Procedure: POSTERIOR REPAIR (RECTOCELE);  Surgeon: Daria Pastures, MD;  Location: [facility];  Service: Gynecology;  Laterality: N/A;   RIGHT ANKLE SURGERY  1996   RECONSTRUCTION   WISDOM TOOTH EXTRACTION      Family History  Problem Relation Age of Onset   Colon cancer Mother        in her 91's    Hyperlipidemia  Mother    Hypertension Mother    Colon polyps Mother    Atrial fibrillation Mother        ablation   Colon cancer Father 50       79-80 or so    Heart disease Father    Hypertension Father    Hypertension Maternal Grandmother    Stroke Paternal Grandfather    Esophageal cancer Neg Hx    Rectal cancer Neg Hx    Stomach cancer Neg Hx     Social History   Socioeconomic History   Marital status: Divorced    Spouse name: Not on file   Number of children: 2   Years of education: Not on file   Highest education level: Not on file  Occupational History   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    Comment: [employer]  Tobacco Use   Smoking status: Former    Packs/day: 0.05    Years: 20.00    Total pack years: 1.00    Types: Cigarettes    Quit date: 02/10/2015    Years since quitting: 7.3   Smokeless tobacco: Never  Substance and Sexual Activity   Alcohol use: Yes    Alcohol/week: 2.0 standard drinks of alcohol    Types: 2 Glasses of wine per week    Comment: socially   Drug use: No   Sexual activity: Yes    Birth control/protection: None  Other Topics Concern   Not on file  Social History Narrative   Separated   2 children   Works as an Engineer, structural PA   Enjoys gym, kayak, paddle, boarding, outside activities   Social Determinants of Radio broadcast assistant Strain: Not on file  Food Insecurity: Not on file  Transportation Needs: Not on file  Physical Activity: Not on file  Stress: Not on file  Social Connections: Not on file  Intimate Partner Violence: Not on file    Outpatient Medications Prior to Visit  Medication Sig Dispense Refill   EPINEPHrine (EPIPEN 2-PAK) 0.3 mg/0.3 mL IJ SOAJ injection Inject 0.3 mg into the muscle as needed for anaphylaxis. 2 each 0   estradiol (ESTRACE) 0.5 MG tablet TK 1 T PO QD  4   gabapentin (NEURONTIN) 300 MG capsule Take 300 mg by mouth 3 (three) times daily as needed. Taking once a day     hydrochlorothiazide (HYDRODIURIL)  25 MG tablet TAKE 1 TABLET(25 MG) BY MOUTH DAILY 90 tablet 1   hydrOXYzine (VISTARIL) 25 MG capsule Take 1 capsule (25 mg total) by mouth every 8 (eight) hours as needed. 30 capsule 0   metoprolol succinate (TOPROL-XL) 50 MG 24 hr tablet Take 1 tablet (50 mg total) by mouth  daily. Take with or immediately following a meal. 30 tablet 0   metroNIDAZOLE (METROGEL) 1 % gel metronidazole 1 % topical gel  APP EXT AA QD     progesterone (PROMETRIUM) 100 MG capsule Take 100 mg by mouth daily.     rizatriptan (MAXALT) 10 MG tablet Take 1 tablet (10 mg total) by mouth as needed for migraine. May repeat in 2 hours if needed 10 tablet 5   valACYclovir (VALTREX) 1000 MG tablet 2 tabs by mouth at start of cold sore then 2 tabs 12 hours later 20 tablet 1   venlafaxine XR (EFFEXOR-XR) 75 MG 24 hr capsule TAKE 1 CAPSULE(75 MG) BY MOUTH DAILY WITH BREAKFAST 90 capsule 1   No facility-administered medications prior to visit.    Allergies  Allergen Reactions   Lisinopril     Lip swelling   Erythromycin Diarrhea    Review of Systems  Psychiatric/Behavioral:  The patient has insomnia.        Objective:    Physical Exam Constitutional:      General: She is not in acute distress.    Appearance: Normal appearance. She is not ill-appearing.  HENT:     Head: Normocephalic and atraumatic.     Right Ear: External ear normal.     Left Ear: External ear normal.  Eyes:     Extraocular Movements: Extraocular movements intact.     Pupils: Pupils are equal, round, and reactive to light.  Cardiovascular:     Rate and Rhythm: Normal rate and regular rhythm.     Heart sounds: Normal heart sounds. No murmur heard.    No gallop.  Pulmonary:     Effort: Pulmonary effort is normal. No respiratory distress.     Breath sounds: Normal breath sounds. No wheezing or rales.  Skin:    General: Skin is warm and dry.  Neurological:     Mental Status: She is alert and oriented to person, place, and time.  Psychiatric:         Judgment: Judgment normal.     BP (!) 142/78   Pulse 78   Temp 98.2 F (36.8 C) (Oral)   Resp 16   Wt 153 lb (69.4 kg)   SpO2 100%   BMI 25.23 kg/m  Wt Readings from Last 3 Encounters:  06/24/22 153 lb (69.4 kg)  02/07/22 158 lb (71.7 kg)  08/23/21 154 lb 6.4 oz (70 kg)       Assessment & Plan:  Primary hypertension Assessment & Plan: Initial reading quite high, repeat reading was almost at goal. Will continue metoprolol at current dose along with hctz.  Pt is advised to check her bp at home and HR a few times and send me her readings via mychart. If SBP >140 at home would consider increasing metoprolol dose.    Anxiety and depression Assessment & Plan: Having a great deal of situational stress. Mother is moving out of her childhood home, her offer on a house was accepted today, daughter is a senior so she is looking at empty nest and she recently broke up with her boyfriend of 2 years which is very upsetting. She notes that her insomnia has improved with the addition of HS hydroxyzine, thought she wakes back up at 3:30 AM and lays awake for 1 hour. She continues effexor and is working with her therapist.  Hopefully as her stressors calm down she will feel better and she will begin sleeping better.    Angioedema, initial encounter Assessment &  Plan: No recurrent symptoms. Now has epi-pen on hand.  Remain off of ACE inhibitors. She has not heard back from the allergy referral re: scheduling and I advised her to give their office a call.     I, Nance Pear, NP, personally preformed the services described in this documentation.  All medical record entries made by the scribe were at my direction and in my presence.  I have reviewed the chart and discharge instructions (if applicable) and agree that the record reflects my personal performance and is accurate and complete. 06/24/2022   I,Shehryar Baig,acting as a scribe for Nance Pear, NP.,have documented  all relevant documentation on the behalf of Nance Pear, NP,as directed by  Nance Pear, NP while in the presence of Nance Pear, NP.   Nance Pear, NP

## 2022-06-28 ENCOUNTER — Ambulatory Visit: Payer: No Typology Code available for payment source | Admitting: Psychology

## 2022-06-28 DIAGNOSIS — F4321 Adjustment disorder with depressed mood: Secondary | ICD-10-CM

## 2022-06-28 NOTE — Progress Notes (Signed)
Whitney King/Therapist Progress Note  Patient ID: Whitney King, MRN: [id number],    Date: 06/28/2022  Time Spent: 4:00pm-4:55pm   55 minutes   Treatment Type: Individual Therapy  Reported Symptoms: sadness  Mental Status Exam: Appearance:  Casual     Behavior: Appropriate  Motor: Normal  Speech/Language:  Normal Rate  Affect: Appropriate  Mood: normal  Thought process: normal  Thought content:   WNL  Sensory/Perceptual disturbances:   WNL  Orientation: oriented to person, place, time/date, and situation  Attention: Good  Concentration: Good  Memory: WNL  Fund of knowledge:  Good  Insight:   Good  Judgment:  Good  Impulse Control: Good   Risk Assessment: Danger to Self:  No Self-injurious Behavior: No Danger to Others: No Duty to Warn:no Physical Aggression / Violence:No  Access to Firearms a concern: No  Gang Involvement:No   Subjective: Pt present for face-to-face individual therapy via video Webex.  Pt consents to telehealth video session due to COVID 19 pandemic. Location of pt: home Location of therapist: home office.   Pt talked about her relationship with Will.  She has not heard from him since they broke up.  Pt is still grieving the loss of the relationship.  Pt states she is still hurt and angry. Helped pt process her feelings and grief.  Pt is focusing on looking for a house to buy.  She has some "what if" fears about home ownership.  Addressed pt's fears and worked on how she can create a healthy narrative to empower herself in her decision making.   Pt is trying to move on.   She has already had a couple of dates with guys she met online.   She is getting together with friends a lot.  Pt has also gone back to church.   Provided supportive therapy.    Interventions: Cognitive Behavioral Therapy and Insight-Oriented  Diagnosis: F43.21  Plan of Care: Recommend ongoing therapy.   Pt participated in setting treatment goals.   Pt  wants to improve coping skills.  Plan to meet every two weeks.   Treatment Plan  (target date: 06/17/2023) Client Abilities/Strengths  Pt is bright, engaging, and motivated for therapy.   Client Treatment Preferences  Individual therapy.  Client Statement of Needs  Improve coping skills.  Symptoms  Depressed or irritable mood. Unresolved grief issues.  Problems Addressed  Unipolar Depression Goals 1. Alleviate depressive symptoms and return to previous level of effective functioning. 2. Appropriately grieve the loss in order to normalize mood and to return to previously adaptive level of functioning. Objective Learn and implement behavioral strategies to overcome depression. Target Date: 2023-06-17 Frequency: Biweekly  Progress: 10 Modality: individual  Related Interventions Engage the client in "behavioral activation," increasing his/her activity level and contact with sources of reward, while identifying processes that inhibit activation.  Use behavioral techniques such as instruction, rehearsal, role-playing, role reversal, as needed, to facilitate activity in the client's daily life; reinforce success. Assist the client in developing skills that increase the likelihood of deriving pleasure from behavioral activation (e.g., assertiveness skills, developing an exercise plan, less internal/more external focus, increased social involvement); reinforce success. Objective Identify important people in life, past and present, and describe the quality, good and poor, of those relationships. Target Date: 2023-06-17 Frequency: Biweekly  Progress: 10 Modality: individual  Related Interventions Conduct Interpersonal Therapy beginning with the assessment of the client's "interpersonal inventory" of important past and present relationships; develop a case formulation linking depression to grief,  interpersonal role disputes, role transitions, and/or interpersonal deficits). Objective Learn and  implement problem-solving and decision-making skills. Target Date: 2023-06-17 Frequency: Biweekly  Progress: 10 Modality: individual  Related Interventions Conduct Problem-Solving Therapy using techniques such as psychoeducation, modeling, and role-playing to teach client problem-solving skills (i.e., defining a problem specifically, generating possible solutions, evaluating the pros and cons of each solution, selecting and implementing a plan of action, evaluating the efficacy of the plan, accepting or revising the plan); role-play application of the problem-solving skill to a real life issue. Encourage in the client the development of a positive problem orientation in which problems and solving them are viewed as a natural part of life and not something to be feared, despaired, or avoided. 3. Develop healthy interpersonal relationships that lead to the alleviation and help prevent the relapse of depression. 4. Develop healthy thinking patterns and beliefs about self, others, and the world that lead to the alleviation and help prevent the relapse of depression. 5. Recognize, accept, and cope with feelings of depression. Diagnosis F43.21 Conditions For Discharge Achievement of treatment goals and objectives   Clint Bolder, LCSW

## 2022-07-08 ENCOUNTER — Other Ambulatory Visit: Payer: Self-pay | Admitting: Family

## 2022-08-01 ENCOUNTER — Ambulatory Visit (INDEPENDENT_AMBULATORY_CARE_PROVIDER_SITE_OTHER): Payer: No Typology Code available for payment source | Admitting: Psychology

## 2022-08-01 DIAGNOSIS — F4321 Adjustment disorder with depressed mood: Secondary | ICD-10-CM

## 2022-08-01 NOTE — Progress Notes (Signed)
[facility] Counselor/Therapist Progress Note  Patient ID: Whitney King, MRN: [id number],    Date: 08/01/2022  Time Spent: 10:00am-10:55am   55 minutes   Treatment Type: Individual Therapy  Reported Symptoms: sadness  Mental Status Exam: Appearance:  Casual     Behavior: Appropriate  Motor: Normal  Speech/Language:  Normal Rate  Affect: Appropriate  Mood: normal  Thought process: normal  Thought content:   WNL  Sensory/Perceptual disturbances:   WNL  Orientation: oriented to person, place, time/date, and situation  Attention: Good  Concentration: Good  Memory: WNL  Fund of knowledge:  Good  Insight:   Good  Judgment:  Good  Impulse Control: Good   Risk Assessment: Danger to Self:  No Self-injurious Behavior: No Danger to Others: No Duty to Warn:no Physical Aggression / Violence:No  Access to Firearms a concern: No  Gang Involvement:No   Subjective: Pt present for face-to-face individual therapy via video Webex.  Pt consents to telehealth video session due to COVID 19 pandemic. Location of pt: home Location of therapist: home office.   Pt talked about buying a condo at [employer].  It is top floor with a beautiful view.   She closes on May 9th.  She feels good about the decision and feels more independent and secure in herself.  Pt's daughter Whitney King was accepted to [employer]. Pt talked about her mother who has gotten into [employer].  Pt and mother will now work on selling her house.  Pt has been dating and is being better about setting healthy boundaries.   One guy she dated was a Programmer, systems and sold his business and worked for [employer] and found out the guy was running a [employer].   He is involved in legal issues and is not working now.  Pt gets frustrated that she finds men with so many red flags.  Addressed how pt can do good boundary setting and make healthy relationship choices.  Pt feels like she is getting over her past relationship and  does not feel as sad.  Pt states she still really wants marriage but with the right person.   Worked on self care strategies.  Pt joined a pickle ball team and is making social connections.  Pt is also back in church and is in a Bible study.   Provided supportive therapy.  Interventions: Cognitive Behavioral Therapy and Insight-Oriented  Diagnosis: F43.21  Plan of Care: Recommend ongoing therapy.   Pt participated in setting treatment goals.   Pt wants to improve coping skills.  Plan to meet every two weeks.   Treatment Plan  (target date: 06/17/2023) Client Abilities/Strengths  Pt is bright, engaging, and motivated for therapy.   Client Treatment Preferences  Individual therapy.  Client Statement of Needs  Improve coping skills.  Symptoms  Depressed or irritable mood. Unresolved grief issues.  Problems Addressed  Unipolar Depression Goals 1. Alleviate depressive symptoms and return to previous level of effective functioning. 2. Appropriately grieve the loss in order to normalize mood and to return to previously adaptive level of functioning. Objective Learn and implement behavioral strategies to overcome depression. Target Date: 2023-06-17 Frequency: Biweekly  Progress: 10 Modality: individual  Related Interventions Engage the client in "behavioral activation," increasing his/her activity level and contact with sources of reward, while identifying processes that inhibit activation.  Use behavioral techniques such as instruction, rehearsal, role-playing, role reversal, as needed, to facilitate activity in the client's daily life; reinforce success. Assist the client in developing  skills that increase the likelihood of deriving pleasure from behavioral activation (e.g., assertiveness skills, developing an exercise plan, less internal/more external focus, increased social involvement); reinforce success. Objective Identify important people in life, past and present, and describe the  quality, good and poor, of those relationships. Target Date: 2023-06-17 Frequency: Biweekly  Progress: 10 Modality: individual  Related Interventions Conduct Interpersonal Therapy beginning with the assessment of the client's "interpersonal inventory" of important past and present relationships; develop a case formulation linking depression to grief, interpersonal role disputes, role transitions, and/or interpersonal deficits). Objective Learn and implement problem-solving and decision-making skills. Target Date: 2023-06-17 Frequency: Biweekly  Progress: 10 Modality: individual  Related Interventions Conduct Problem-Solving Therapy using techniques such as psychoeducation, modeling, and role-playing to teach client problem-solving skills (i.e., defining a problem specifically, generating possible solutions, evaluating the pros and cons of each solution, selecting and implementing a plan of action, evaluating the efficacy of the plan, accepting or revising the plan); role-play application of the problem-solving skill to a real life issue. Encourage in the client the development of a positive problem orientation in which problems and solving them are viewed as a natural part of life and not something to be feared, despaired, or avoided. 3. Develop healthy interpersonal relationships that lead to the alleviation and help prevent the relapse of depression. 4. Develop healthy thinking patterns and beliefs about self, others, and the world that lead to the alleviation and help prevent the relapse of depression. 5. Recognize, accept, and cope with feelings of depression. Diagnosis F43.21 Conditions For Discharge Achievement of treatment goals and objectives   Whitney Fick, LCSW

## 2022-08-07 ENCOUNTER — Encounter: Payer: Self-pay | Admitting: Family

## 2022-08-08 ENCOUNTER — Ambulatory Visit: Payer: No Typology Code available for payment source | Admitting: Family

## 2022-09-26 ENCOUNTER — Ambulatory Visit: Payer: No Typology Code available for payment source | Admitting: Family

## 2022-09-26 VITALS — BP 110/85 | HR 71 | Temp 98.0°F | Resp 16 | Ht 65.5 in | Wt 158.0 lb

## 2022-09-26 DIAGNOSIS — D179 Benign lipomatous neoplasm, unspecified: Secondary | ICD-10-CM

## 2022-09-26 DIAGNOSIS — F32A Depression, unspecified: Secondary | ICD-10-CM

## 2022-09-26 DIAGNOSIS — E039 Hypothyroidism, unspecified: Secondary | ICD-10-CM

## 2022-09-26 DIAGNOSIS — Z8619 Personal history of other infectious and parasitic diseases: Secondary | ICD-10-CM

## 2022-09-26 DIAGNOSIS — G43809 Other migraine, not intractable, without status migrainosus: Secondary | ICD-10-CM

## 2022-09-26 DIAGNOSIS — F419 Anxiety disorder, unspecified: Secondary | ICD-10-CM | POA: Diagnosis not present

## 2022-09-26 DIAGNOSIS — I10 Essential (primary) hypertension: Secondary | ICD-10-CM

## 2022-09-26 LAB — TSH: TSH: 2.4 u[IU]/mL (ref 0.35–5.50)

## 2022-09-26 LAB — BASIC METABOLIC PANEL
BUN: 19 mg/dL (ref 6–23)
CO2: 28 mEq/L (ref 19–32)
Calcium: 9.5 mg/dL (ref 8.4–10.5)
Chloride: 102 mEq/L (ref 96–112)
Creatinine, Ser: 0.71 mg/dL (ref 0.40–1.20)
GFR: 97.66 mL/min (ref >60.00)
Glucose, Bld: 100 mg/dL — ABNORMAL HIGH (ref 70–99)
Potassium: 4.3 mEq/L (ref 3.5–5.1)
Sodium: 139 mEq/L (ref 135–145)

## 2022-09-26 MED ORDER — VALACYCLOVIR HCL 1 G PO TABS: ORAL_TABLET | ORAL | 1 refills | Status: AC

## 2022-09-26 MED ORDER — VENLAFAXINE HCL ER 75 MG PO CP24
ORAL_CAPSULE | ORAL | 1 refills | Status: DC
Start: 2022-09-26 — End: 2023-05-09

## 2022-09-26 MED ORDER — RIZATRIPTAN BENZOATE 10 MG PO TABS
10.0000 mg | ORAL_TABLET | ORAL | 5 refills | Status: DC | PRN
Start: 2022-09-26 — End: 2024-02-06

## 2022-09-26 MED ORDER — HYDROCHLOROTHIAZIDE 25 MG PO TABS
ORAL_TABLET | ORAL | 1 refills | Status: DC
Start: 2022-09-26 — End: 2023-03-28

## 2022-09-26 NOTE — Assessment & Plan Note (Signed)
New. Offered US/surgery referral. She declines will have it looked at at her work by Careers adviser she works with.

## 2022-09-26 NOTE — Progress Notes (Signed)
Subjective:     Patient ID: Whitney King, female    DOB: 1971/03/25, 52 y.o.   MRN: [id number]  Chief Complaint  Patient presents with   Hypothyroidism    Here for follow up   Hypertension    Here for follow up   Discussed the use of AI scribe software for clinical note transcription with the patient, who gave verbal consent to proceed.  History of Present Illness   The patient, with a history of hypertension, presents for a blood pressure follow-up. They report that their blood pressure has been 'okay' at home. They are currently on HCTZ 25mg  and Toprol XL 50mg  for blood pressure management.  The patient is also on Effexor for mood management, which they report is working 'fine.' They deny any recent migraines and do not believe they need a refill of Maxalt. They use Valtrex as needed and request a refill as they believe an outbreak may be starting. They also report being on hormone therapy, which has eliminated any hot flashes.  The patient has a concern about a possible lipoma, which they feel is getting bigger. They express interest in having it ultrasounded and possibly removed.      Health Maintenance Due  Topic Date Due   MAMMOGRAM  06/18/2021    Past Medical History:  Diagnosis Date   ABNORMAL FINDINGS, ELEVATED BP W/O HTN 09/13/2006   Qualifier: Diagnosis of  By: Drue Novel MD, Jose E.    ACNE NEC 09/08/2006   Qualifier: Diagnosis of  By: Drue Novel MD, Nolon Rod.    Allergy    Anxiety    Anxiety and depression 09/08/2006   Qualifier: Diagnosis of  By: Drue Novel MD, Nolon Rod.    Arthritis    mild    Asthma    exercise induced, allergies, no problems since allergy shots   Bipolar disorder (HCC)    pt denies    GERD (gastroesophageal reflux disease)    diet controlled only   Headache(784.0)    otc meds prn   HTN (hypertension) 03/16/2015   Hypertension    no medications at this time- post pregnancy   Hypothyroidism 05/18/2016   IBS (irritable bowel syndrome)    Mental disorder     Migraines    OTHER&UNSPECIFIED DISEASES THE ORAL SOFT TISSUES 03/24/2009   Qualifier: Diagnosis of  By: Alwyn Ren MD, William     PONV (postoperative nausea and vomiting)    Positive ANA (antinuclear antibody) 07/26/2017   Seasonal allergies    SVD (spontaneous vaginal delivery)    x 1    Past Surgical History:  Procedure Laterality Date   COLONOSCOPY     COLONOSCOPY W/ POLYPECTOMY     DILITATION & CURRETTAGE/HYSTROSCOPY WITH NOVASURE ABLATION N/A 10/17/2012   Procedure: DILATATION & CURETTAGE/HYSTEROSCOPY WITH NOVASURE ABLATION;  Surgeon: Loney Laurence, MD;  Location: [facility];  Service: Gynecology;  Laterality: N/A;   EYE SURGERY     PRK - bilateral eyes   LAPAROSCOPIC TUBAL LIGATION Bilateral 07/15/2015   Procedure: LAPAROSCOPIC TUBAL LIGATION;  Surgeon: Carrington Clamp, MD;  Location: [facility];  Service: Gynecology;  Laterality: Bilateral;  FILSHIE CLIPS   left leg vein surgery Bilateral    Laser   left wrist surgery  2012   fracture repair   LUMBAR LAMINECTOMY/DECOMPRESSION MICRODISCECTOMY N/A 10/28/2015   Procedure: LUMBAR 4-5 DECOMPRESSION ;  Surgeon: Estill Bamberg, MD;  Location: [facility];  Service: Orthopedics;  Laterality: N/A;  LUMBAR 4-5 DECOMPRESSION - cyst removed that was pressing on  nerve per pt    POLYPECTOMY     RECTOCELE REPAIR N/A 10/17/2012   Procedure: POSTERIOR REPAIR (RECTOCELE);  Surgeon: Loney Laurence, MD;  Location: [facility];  Service: Gynecology;  Laterality: N/A;   RIGHT ANKLE SURGERY  1996   RECONSTRUCTION   WISDOM TOOTH EXTRACTION      Family History  Problem Relation Age of Onset   Colon cancer Mother        in her 19's    Hyperlipidemia Mother    Hypertension Mother    Colon polyps Mother    Atrial fibrillation Mother        ablation   Colon cancer Father 47       68-80 or so    Heart disease Father    Hypertension Father    Hypertension Maternal Grandmother    Stroke Paternal Grandfather    Esophageal cancer Neg Hx    Rectal cancer Neg Hx     Stomach cancer Neg Hx     Social History   Socioeconomic History   Marital status: Divorced    Spouse name: Not on file   Number of children: 2   Years of education: Not on file   Highest education level: Master's degree (e.g., MA, MS, MEng, MEd, MSW, MBA)  Occupational History   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    Comment: [employer]  Tobacco Use   Smoking status: Former    Packs/day: 0.05    Years: 20.00    Additional pack years: 0.00    Total pack years: 1.00    Types: Cigarettes    Quit date: 02/10/2015    Years since quitting: 7.6   Smokeless tobacco: Never  Substance and Sexual Activity   Alcohol use: Yes    Alcohol/week: 2.0 standard drinks of alcohol    Types: 2 Glasses of wine per week    Comment: socially   Drug use: No   Sexual activity: Yes    Birth control/protection: None  Other Topics Concern   Not on file  Social History Narrative   Separated   2 children   Works as an Mudlogger PA   Enjoys gym, kayak, paddle, boarding, outside activities   Social Determinants of Health   Financial Resource Strain: Patient Declined (09/20/2022)   Overall Financial Resource Strain (CARDIA)    Difficulty of Paying Living Expenses: Patient declined  Food Insecurity: No Food Insecurity (09/20/2022)   Hunger Vital Sign    Worried About Running Out of Food in the Last Year: Never true    Ran Out of Food in the Last Year: Never true  Transportation Needs: No Transportation Needs (09/20/2022)   PRAPARE - Administrator, Civil Service (Medical): No    Lack of Transportation (Non-Medical): No  Physical Activity: Insufficiently Active (09/20/2022)   Exercise Vital Sign    Days of Exercise per Week: 3 days    Minutes of Exercise per Session: 30 min  Stress: No Stress Concern Present (09/20/2022)   [employer] of Occupational Health - Occupational Stress Questionnaire    Feeling of Stress : Only a little  Social Connections: Unknown (09/20/2022)    Social Connection and Isolation Panel [NHANES]    Frequency of Communication with Friends and Family: More than three times a week    Frequency of Social Gatherings with Friends and Family: More than three times a week    Attends Religious Services: Patient declined    Database administrator or Organizations: Yes  Attends Banker Meetings: Patient declined    Marital Status: Divorced  Catering manager Violence: Not on file    Outpatient Medications Prior to Visit  Medication Sig Dispense Refill   estradiol (ESTRACE) 0.5 MG tablet TK 1 T PO QD  4   hydrOXYzine (VISTARIL) 25 MG capsule Take 1 capsule (25 mg total) by mouth every 8 (eight) hours as needed. 30 capsule 0   metoprolol succinate (TOPROL-XL) 50 MG 24 hr tablet TAKE 1 TABLET(50 MG) BY MOUTH DAILY WITH OR IMMEDIATELY FOLLOWING A MEAL 90 tablet 0   metroNIDAZOLE (METROGEL) 1 % gel metronidazole 1 % topical gel  APP EXT AA QD     progesterone (PROMETRIUM) 100 MG capsule Take 100 mg by mouth daily.     hydrochlorothiazide (HYDRODIURIL) 25 MG tablet TAKE 1 TABLET(25 MG) BY MOUTH DAILY 90 tablet 1   rizatriptan (MAXALT) 10 MG tablet Take 1 tablet (10 mg total) by mouth as needed for migraine. May repeat in 2 hours if needed 10 tablet 5   valACYclovir (VALTREX) 1000 MG tablet 2 tabs by mouth at start of cold sore then 2 tabs 12 hours later 20 tablet 1   venlafaxine XR (EFFEXOR-XR) 75 MG 24 hr capsule TAKE 1 CAPSULE(75 MG) BY MOUTH DAILY WITH BREAKFAST 90 capsule 1   EPINEPHrine (EPIPEN 2-PAK) 0.3 mg/0.3 mL IJ SOAJ injection Inject 0.3 mg into the muscle as needed for anaphylaxis. 2 each 0   gabapentin (NEURONTIN) 300 MG capsule Take 300 mg by mouth 3 (three) times daily as needed. Taking once a day     No facility-administered medications prior to visit.    Allergies  Allergen Reactions   Lisinopril     Lip swelling   Erythromycin Diarrhea    ROS See HPI    Objective:    Physical Exam Constitutional:       General: She is not in acute distress.    Appearance: Normal appearance. She is well-developed.  HENT:     Head: Normocephalic and atraumatic.     Right Ear: External ear normal.     Left Ear: External ear normal.  Eyes:     General: No scleral icterus. Neck:     Thyroid: No thyromegaly.  Cardiovascular:     Rate and Rhythm: Normal rate and regular rhythm.     Heart sounds: Normal heart sounds. No murmur heard. Pulmonary:     Effort: Pulmonary effort is normal. No respiratory distress.     Breath sounds: Normal breath sounds. No wheezing.  Musculoskeletal:     Cervical back: Neck supple.     Comments: Mobile, non-tender soft tissue mass right anterior thigh  Skin:    General: Skin is warm and dry.  Neurological:     Mental Status: She is alert and oriented to person, place, and time.  Psychiatric:        Mood and Affect: Mood normal.        Behavior: Behavior normal.        Thought Content: Thought content normal.        Judgment: Judgment normal.     BP 110/85   Pulse 71   Temp 98 F (36.7 C) (Oral)   Resp 16   Ht 5' 5.5" (1.664 m)   Wt 158 lb (71.7 kg)   SpO2 100%   BMI 25.89 kg/m  Wt Readings from Last 3 Encounters:  09/26/22 158 lb (71.7 kg)  06/24/22 153 lb (69.4 kg)  02/07/22 158 lb (  71.7 kg)       Assessment & Plan:   Problem List Items Addressed This Visit       Unprioritized   Migraines    Stable, continue maxalt prn.       Relevant Medications   hydrochlorothiazide (HYDRODIURIL) 25 MG tablet   venlafaxine XR (EFFEXOR-XR) 75 MG 24 hr capsule   rizatriptan (MAXALT) 10 MG tablet   Lipoma    New. Offered US/surgery referral. She declines will have it looked at at her work by Careers adviser she works with.       Hypothyroidism    Not on synthroid. Check tsh.       Relevant Orders   TSH   Hx of cold sores    Pt reports possible outbreak on lip, requesting refill of valtex.      HTN (hypertension) - Primary     Initial blood pressure elevated,  but patient reports it has been controlled at home. Currently on HCTZ 25mg  and Toprol XL 50mg . -Recheck blood pressure today. -Order BMP to check potassium and kidney function.       Relevant Medications   hydrochlorothiazide (HYDRODIURIL) 25 MG tablet   Other Relevant Orders   Basic Metabolic Panel (BMET)   Anxiety and depression    Stable mood on Effexor. -Continue Effexor.       Relevant Medications   venlafaxine XR (EFFEXOR-XR) 75 MG 24 hr capsule    I have discontinued Tonji Naab's gabapentin and EPINEPHrine. I am also having her maintain her estradiol, progesterone, metroNIDAZOLE, hydrOXYzine, metoprolol succinate, valACYclovir, hydrochlorothiazide, venlafaxine XR, and rizatriptan.  Meds ordered this encounter  Medications   valACYclovir (VALTREX) 1000 MG tablet    Sig: 2 tabs by mouth at start of cold sore then 2 tabs 12 hours later    Dispense:  20 tablet    Refill:  1    Order Specific Question:   Supervising Provider    Answer:   Danise Edge A [[id number]]   hydrochlorothiazide (HYDRODIURIL) 25 MG tablet    Sig: TAKE 1 TABLET(25 MG) BY MOUTH DAILY    Dispense:  90 tablet    Refill:  1    Order Specific Question:   Supervising Provider    Answer:   Danise Edge A [[id number]]   venlafaxine XR (EFFEXOR-XR) 75 MG 24 hr capsule    Sig: TAKE 1 CAPSULE(75 MG) BY MOUTH DAILY WITH BREAKFAST    Dispense:  90 capsule    Refill:  1    Order Specific Question:   Supervising Provider    Answer:   Danise Edge A [[id number]]   rizatriptan (MAXALT) 10 MG tablet    Sig: Take 1 tablet (10 mg total) by mouth as needed for migraine. May repeat in 2 hours if needed    Dispense:  10 tablet    Refill:  5    Order Specific Question:   Supervising Provider    Answer:   Danise Edge A [[id number]]

## 2022-09-26 NOTE — Assessment & Plan Note (Signed)
Stable mood on Effexor. -Continue Effexor.

## 2022-09-26 NOTE — Assessment & Plan Note (Signed)
Not on synthroid. Check tsh.

## 2022-09-26 NOTE — Assessment & Plan Note (Signed)
Pt reports possible outbreak on lip, requesting refill of valtex.

## 2022-09-26 NOTE — Assessment & Plan Note (Signed)
Stable, continue maxalt prn.

## 2022-09-26 NOTE — Assessment & Plan Note (Signed)
Initial blood pressure elevated, but patient reports it has been controlled at home. Currently on HCTZ 25mg  and Toprol XL 50mg . -Recheck blood pressure today. -Order BMP to check potassium and kidney function.

## 2022-10-04 ENCOUNTER — Other Ambulatory Visit: Payer: Self-pay

## 2022-10-04 ENCOUNTER — Other Ambulatory Visit: Payer: Self-pay | Admitting: Family

## 2022-10-04 NOTE — Telephone Encounter (Signed)
Patient reports she is changing gyn provider and requesting a one month supply of estradiol. She is out of medication, please advise if ok.

## 2022-10-05 ENCOUNTER — Other Ambulatory Visit: Payer: Self-pay | Admitting: Family

## 2022-10-05 MED ORDER — ESTRADIOL 0.5 MG PO TABS: ORAL_TABLET | ORAL | 0 refills | Status: AC

## 2022-10-13 LAB — RESULTS CONSOLE HPV: CHL HPV: NEGATIVE

## 2022-10-13 LAB — HM PAP SMEAR

## 2022-11-01 ENCOUNTER — Other Ambulatory Visit: Payer: Self-pay | Admitting: Obstetrics and Gynecology

## 2022-11-01 DIAGNOSIS — N644 Mastodynia: Secondary | ICD-10-CM

## 2022-11-28 ENCOUNTER — Other Ambulatory Visit: Payer: Self-pay | Admitting: Obstetrics and Gynecology

## 2022-11-28 ENCOUNTER — Ambulatory Visit
Admission: RE | Admit: 2022-11-28 | Discharge: 2022-11-28 | Disposition: A | Discharge status: Home / Self Care | Payer: No Typology Code available for payment source | Source: Ambulatory Visit | Attending: Obstetrics and Gynecology | Admitting: Obstetrics and Gynecology

## 2022-11-28 DIAGNOSIS — N644 Mastodynia: Secondary | ICD-10-CM

## 2022-11-28 DIAGNOSIS — N6320 Unspecified lump in the left breast, unspecified quadrant: Secondary | ICD-10-CM

## 2022-12-23 ENCOUNTER — Other Ambulatory Visit: Payer: Self-pay | Admitting: Family

## 2023-01-02 ENCOUNTER — Other Ambulatory Visit: Payer: Self-pay | Admitting: Family

## 2023-03-27 ENCOUNTER — Other Ambulatory Visit: Payer: Self-pay | Admitting: Family

## 2023-03-27 DIAGNOSIS — I10 Essential (primary) hypertension: Secondary | ICD-10-CM

## 2023-05-08 ENCOUNTER — Other Ambulatory Visit: Payer: Self-pay | Admitting: Family

## 2023-05-08 DIAGNOSIS — F32A Depression, unspecified: Secondary | ICD-10-CM

## 2023-06-11 ENCOUNTER — Other Ambulatory Visit: Payer: Self-pay | Admitting: Family

## 2023-06-11 DIAGNOSIS — F419 Anxiety disorder, unspecified: Secondary | ICD-10-CM

## 2023-06-12 ENCOUNTER — Other Ambulatory Visit: Payer: Self-pay | Admitting: Family

## 2023-06-12 ENCOUNTER — Encounter: Payer: Self-pay | Admitting: Family

## 2023-06-12 DIAGNOSIS — F32A Depression, unspecified: Secondary | ICD-10-CM

## 2023-06-12 NOTE — Telephone Encounter (Signed)
 Copied from CRM [id number]. Topic: Clinical - Medication Refill >> Jun 12, 2023 12:54 PM [place] J wrote: Most Recent Primary Care Visit:  Provider: O'SULLIVAN, MELISSA  Department: [facility]  Visit Type: OFFICE VISIT  Date: 09/26/2022  Medication: venlafaxine XR (EFFEXOR-XR) 75 MG  Has the patient contacted their pharmacy? Yes (Agent: If no, request that the patient contact the pharmacy for the refill. If patient does not wish to contact the pharmacy document the reason why and proceed with request.) (Agent: If yes, when and what did the pharmacy advise?)  Is this the correct pharmacy for this prescription? Yes If no, delete pharmacy and type the correct one.  This is the patient's preferred pharmacy:  [id number] [place], [place] - [address] AT [facility] [address] [facility] [address] [place] [address] Phone: [phone] Fax: [phone]   Has the prescription been filled recently? No  Is the patient out of the medication? Yes  Has the patient been seen for an appointment in the last year OR does the patient have an upcoming appointment? Yes  Can we respond through MyChart? Yes  Agent: Please be advised that Rx refills may take up to 3 business days. We ask that you follow-up with your pharmacy.

## 2023-06-12 NOTE — Telephone Encounter (Signed)
 Called and left voicemail for patient to inform her the prescription has been sent to her [facility] pharmacy.

## 2023-06-12 NOTE — Telephone Encounter (Signed)
 Duplicate request

## 2023-06-12 NOTE — Telephone Encounter (Signed)
 Medication refill was already prescribed today.

## 2023-06-12 NOTE — Telephone Encounter (Signed)
 Please contact pt to schedule a follow up visit.

## 2023-06-13 NOTE — Telephone Encounter (Signed)
 Lvm 2 sch.

## 2023-07-05 ENCOUNTER — Other Ambulatory Visit: Payer: Self-pay | Admitting: Family

## 2023-07-05 DIAGNOSIS — F32A Depression, unspecified: Secondary | ICD-10-CM

## 2023-07-17 ENCOUNTER — Other Ambulatory Visit: Payer: No Typology Code available for payment source

## 2023-07-18 ENCOUNTER — Ambulatory Visit: Admitting: Family

## 2023-07-19 ENCOUNTER — Ambulatory Visit: Admitting: Family

## 2023-07-19 VITALS — BP 138/82 | HR 69 | Temp 99.2°F | Resp 16 | Ht 65.5 in | Wt 151.0 lb

## 2023-07-19 DIAGNOSIS — I10 Essential (primary) hypertension: Secondary | ICD-10-CM | POA: Diagnosis not present

## 2023-07-19 DIAGNOSIS — Z800 Family history of malignant neoplasm of digestive organs: Secondary | ICD-10-CM

## 2023-07-19 DIAGNOSIS — F419 Anxiety disorder, unspecified: Secondary | ICD-10-CM

## 2023-07-19 DIAGNOSIS — G43809 Other migraine, not intractable, without status migrainosus: Secondary | ICD-10-CM

## 2023-07-19 DIAGNOSIS — F32A Depression, unspecified: Secondary | ICD-10-CM

## 2023-07-19 NOTE — Progress Notes (Unsigned)
 Subjective:     Patient ID: Whitney King, female    DOB: [birth year], 53 y.o.   MRN: [id number]  Chief Complaint  Patient presents with  . Hypertension    Here for follow up  . Hypothyroidism    Here for follow up    HPI  Discussed the use of AI scribe software for clinical note transcription with the patient, who gave verbal consent to proceed.  History of Present Illness       Health Maintenance Due  Topic Date Due  . INFLUENZA VACCINE  11/24/2022  . Cervical Cancer Screening (HPV/Pap Cotest)  06/19/2023    Past Medical History:  Diagnosis Date  . ABNORMAL FINDINGS, ELEVATED BP W/O HTN 09/13/2006   Qualifier: Diagnosis of  By: Drue Novel MD, Jose E.   . ACNE NEC 09/08/2006   Qualifier: Diagnosis of  By: Drue Novel MD, Nolon Rod   . Allergy   . Anxiety   . Anxiety and depression 09/08/2006   Qualifier: Diagnosis of  By: Drue Novel MD, Jose E.   . Arthritis    mild   . Asthma    exercise induced, allergies, no problems since allergy shots  . Bipolar disorder (HCC)    pt denies   . GERD (gastroesophageal reflux disease)    diet controlled only  . Headache(784.0)    otc meds prn  . HTN (hypertension) 03/16/2015  . Hypertension    no medications at this time- post pregnancy  . Hypothyroidism 05/18/2016  . IBS (irritable bowel syndrome)   . Mental disorder   . Migraines   . OTHER&UNSPECIFIED DISEASES THE ORAL SOFT TISSUES 03/24/2009   Qualifier: Diagnosis of  By: Alwyn Ren MD, Chrissie Noa    . PONV (postoperative nausea and vomiting)   . Positive ANA (antinuclear antibody) 07/26/2017  . Seasonal allergies   . SVD (spontaneous vaginal delivery)    x 1    Past Surgical History:  Procedure Laterality Date  . COLONOSCOPY    . COLONOSCOPY W/ POLYPECTOMY    . DILITATION & CURRETTAGE/HYSTROSCOPY WITH NOVASURE ABLATION N/A 10/17/2012   Procedure: DILATATION & CURETTAGE/HYSTEROSCOPY WITH NOVASURE ABLATION;  Surgeon: Loney Laurence, MD;  Location: [facility];  Service: Gynecology;   Laterality: N/A;  . EYE SURGERY     PRK - bilateral eyes  . LAPAROSCOPIC TUBAL LIGATION Bilateral 07/15/2015   Procedure: LAPAROSCOPIC TUBAL LIGATION;  Surgeon: Carrington Clamp, MD;  Location: [facility];  Service: Gynecology;  Laterality: Bilateral;  FILSHIE CLIPS  . left leg vein surgery Bilateral    Laser  . left wrist surgery  2012   fracture repair  . LUMBAR LAMINECTOMY/DECOMPRESSION MICRODISCECTOMY N/A 10/28/2015   Procedure: LUMBAR 4-5 DECOMPRESSION ;  Surgeon: Estill Bamberg, MD;  Location: [facility];  Service: Orthopedics;  Laterality: N/A;  LUMBAR 4-5 DECOMPRESSION - cyst removed that was pressing on nerve per pt   . POLYPECTOMY    . RECTOCELE REPAIR N/A 10/17/2012   Procedure: POSTERIOR REPAIR (RECTOCELE);  Surgeon: Loney Laurence, MD;  Location: [facility];  Service: Gynecology;  Laterality: N/A;  . RIGHT ANKLE SURGERY  1996   RECONSTRUCTION  . WISDOM TOOTH EXTRACTION      Family History  Problem Relation Age of Onset  . Colon cancer Mother        in her 79's   . Hyperlipidemia Mother   . Hypertension Mother   . Colon polyps Mother   . Atrial fibrillation Mother        ablation  . Colon  cancer Father 25       79-80 or so   . Heart disease Father   . Hypertension Father   . Hypertension Maternal Grandmother   . Stroke Paternal Grandfather   . Esophageal cancer Neg Hx   . Rectal cancer Neg Hx   . Stomach cancer Neg Hx     Social History   Socioeconomic History  . Marital status: Married    Spouse name: Not on file  . Number of children: 2  . Years of education: Not on file  . Highest education level: Master's degree (e.g., MA, MS, MEng, MEd, MSW, MBA)  Occupational History  . 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    Comment: [employer]  Tobacco Use  . Smoking status: Former    Current packs/day: 0.00    Average packs/day: 0.1 packs/day for 20.0 years (1.0 ttl pk-yrs)    Types: Cigarettes    Start date: 02/10/1995    Quit date: 02/10/2015    Years since quitting: 8.4  .  Smokeless tobacco: Never  Substance and Sexual Activity  . Alcohol use: Yes    Alcohol/week: 2.0 standard drinks of alcohol    Types: 2 Glasses of wine per week    Comment: socially  . Drug use: No  . Sexual activity: Yes    Birth control/protection: None  Other Topics Concern  . Not on file  Social History Narrative   Separated   2 children   Works as an Mudlogger PA   Enjoys gym, kayak, paddle, boarding, outside activities   Social Drivers of Health   Financial Resource Strain: Patient Declined (09/20/2022)   Overall Financial Resource Strain (CARDIA)   . Difficulty of Paying Living Expenses: Patient declined  Food Insecurity: No Food Insecurity (09/20/2022)   Hunger Vital Sign   . Worried About Programme researcher, broadcasting/film/video in the Last Year: Never true   . Ran Out of Food in the Last Year: Never true  Transportation Needs: No Transportation Needs (09/20/2022)   PRAPARE - Transportation   . Lack of Transportation (Medical): No   . Lack of Transportation (Non-Medical): No  Physical Activity: Insufficiently Active (09/20/2022)   Exercise Vital Sign   . Days of Exercise per Week: 3 days   . Minutes of Exercise per Session: 30 min  Stress: No Stress Concern Present (09/20/2022)   [employer] of Occupational Health - Occupational Stress Questionnaire   . Feeling of Stress : Only a little  Social Connections: Unknown (09/20/2022)   Social Connection and Isolation Panel [NHANES]   . Frequency of Communication with Friends and Family: More than three times a week   . Frequency of Social Gatherings with Friends and Family: More than three times a week   . Attends Religious Services: Patient declined   . Active Member of Clubs or Organizations: Yes   . Attends Banker Meetings: Patient declined   . Marital Status: Divorced  Catering manager Violence: Not on file    Outpatient Medications Prior to Visit  Medication Sig Dispense Refill  . estradiol (ESTRACE) 0.5  MG tablet TK 1 T PO QD 30 tablet 0  . hydrochlorothiazide (HYDRODIURIL) 25 MG tablet TAKE 1 TABLET(25 MG) BY MOUTH DAILY 90 tablet 1  . hydrOXYzine (VISTARIL) 25 MG capsule Take 1 capsule (25 mg total) by mouth every 8 (eight) hours as needed. 30 capsule 0  . metoprolol succinate (TOPROL-XL) 50 MG 24 hr tablet TAKE 1 TABLET(50 MG) BY MOUTH DAILY WITH OR IMMEDIATELY FOLLOWING  A MEAL 90 tablet 1  . metroNIDAZOLE (METROGEL) 1 % gel metronidazole 1 % topical gel  APP EXT AA QD    . progesterone (PROMETRIUM) 100 MG capsule Take 100 mg by mouth daily.    . rizatriptan (MAXALT) 10 MG tablet Take 1 tablet (10 mg total) by mouth as needed for migraine. May repeat in 2 hours if needed 10 tablet 5  . valACYclovir (VALTREX) 1000 MG tablet 2 tabs by mouth at start of cold sore then 2 tabs 12 hours later 20 tablet 1  . venlafaxine XR (EFFEXOR-XR) 75 MG 24 hr capsule TAKE 1 CAPSULE(75 MG) BY MOUTH DAILY WITH BREAKFAST 30 capsule 0   No facility-administered medications prior to visit.    Allergies  Allergen Reactions  . Lisinopril     Lip swelling  . Erythromycin Diarrhea    ROS     Objective:    Physical Exam   BP (!) 157/89 (BP Location: Right Arm, Patient Position: Sitting, Cuff Size: Small)   Pulse 69   Temp 99.2 F (37.3 C) (Oral)   Resp 16   Ht 5' 5.5" (1.664 m)   Wt 151 lb (68.5 kg)   SpO2 100%   BMI 24.75 kg/m  Wt Readings from Last 3 Encounters:  07/19/23 151 lb (68.5 kg)  09/26/22 158 lb (71.7 kg)  06/24/22 153 lb (69.4 kg)       Assessment & Plan:   Problem List Items Addressed This Visit   None   I am having Whitney King maintain her progesterone, metroNIDAZOLE, hydrOXYzine, valACYclovir, rizatriptan, estradiol, metoprolol succinate, hydrochlorothiazide, and venlafaxine XR.  No orders of the defined types were placed in this encounter.

## 2023-07-19 NOTE — Progress Notes (Unsigned)
   Established Patient Office Visit  Subjective   Patient ID: Whitney King, female    DOB: [birth year]  Age: 53 y.o. MRN: [id number]  Chief Complaint  Patient presents with   Hypertension    Here for follow up   Hypothyroidism    Here for follow up    53 yo presents for follow up of routine medication therapy. She denies any complaints at this time. Reports mood is stable on Effexor-XR. Reports infrequent migraine headaches. Reports that Maxalt is effective in managing migraines when they occur. No recent cold sores, but states she normally has outbreaks during the summer months.   Requesting refills of hydrochlorothiazide, metoprolol, Maxalt, Valtrex, and Effexor.   Hypertension   Review of Systems  Cardiovascular: Negative.   Gastrointestinal: Negative.   Neurological: Negative.   Psychiatric/Behavioral: Negative.        Objective:     BP (!) 157/89 (BP Location: Right Arm, Patient Position: Sitting, Cuff Size: Small)   Pulse 69   Temp 99.2 F (37.3 C) (Oral)   Resp 16   Ht 5' 5.5" (1.664 m)   Wt 151 lb (68.5 kg)   SpO2 100%   BMI 24.75 kg/m  {Vitals History (Optional):23777}  Physical Exam Vitals reviewed.  Constitutional:      Appearance: Normal appearance.  HENT:     Head: Normocephalic and atraumatic.  Cardiovascular:     Rate and Rhythm: Normal rate and regular rhythm.     Pulses: Normal pulses.     Heart sounds: Normal heart sounds.  Pulmonary:     Effort: Pulmonary effort is normal.     Breath sounds: Normal breath sounds.  Skin:    General: Skin is warm and dry.  Neurological:     Mental Status: She is alert and oriented to person, place, and time.  Psychiatric:        Mood and Affect: Mood normal.        Behavior: Behavior normal.        Thought Content: Thought content normal.    {Labs (Optional):23779}    Assessment & Plan:   -Hypertension - stable on hydrochlorothiazide and metoprolol succinate.  -Migraines - stable on Maxalt.  Reports infrequent migraines.  -Anxiety and depression - patient reports mood is stable on Effexor-XR.  -History of cold sores - no recent outbreaks.  Cristopher Peru, RN

## 2023-07-20 ENCOUNTER — Encounter: Payer: Self-pay | Admitting: Family

## 2023-07-20 ENCOUNTER — Telehealth: Payer: Self-pay | Admitting: Family

## 2023-07-20 DIAGNOSIS — Z800 Family history of malignant neoplasm of digestive organs: Secondary | ICD-10-CM | POA: Insufficient documentation

## 2023-07-20 NOTE — Patient Instructions (Signed)
 VISIT SUMMARY:  Today, you came in for medication refills and a routine check-up. We discussed your current medications, blood pressure, mood management, and follow-up for breast cysts and general health maintenance.  YOUR PLAN:  -BREAST CYST: A breast cyst is a fluid-filled sac within the breast, which is usually benign. You need to follow up with imaging every six months. If fluid is present, we can consider cyst aspiration to reduce the frequency of imaging. Please schedule your follow-up breast imaging soon.  -HYPERTENSION: Hypertension, or high blood pressure, was noted to be slightly elevated today but is currently acceptable at 138/82 mmHg. You are taking metoprolol XL 50 mg and hydrochlorothiazide to manage it. We will refill your prescriptions and monitor your electrolytes regularly.  -MOOD DISORDER: A mood disorder affects your emotional state. You are currently taking Effexor for mood stabilization and have no current issues. We will refill your Effexor prescription.  -GENERAL HEALTH MAINTENANCE: You are up to date with your mammograms. Your last colonoscopy was approximately three years ago, and you are on a five-year schedule. We will check your records to confirm the date and place a referral if it is due.  INSTRUCTIONS:  Please schedule your follow-up breast imaging as soon as possible. We will also check your records for the date of your last colonoscopy and place a referral if it is due. Your prescriptions for metoprolol XL, hydrochlorothiazide, and Effexor have been refilled at [facility]. Additionally, we will order a future lab draw to monitor your electrolytes.

## 2023-07-20 NOTE — Assessment & Plan Note (Signed)
 BP Readings from Last 3 Encounters:  07/19/23 138/82  09/26/22 110/85  06/24/22 (!) 142/78   BP stable, continue hydrochlorothiazide and toprol xl.

## 2023-07-20 NOTE — Assessment & Plan Note (Signed)
 Has maxalt for prn use.

## 2023-07-20 NOTE — Assessment & Plan Note (Signed)
 Stable on effexor.  Recently got married and is doing well.

## 2023-07-20 NOTE — Assessment & Plan Note (Signed)
 Next colo due 11/26.

## 2023-07-20 NOTE — Telephone Encounter (Signed)
 See mychart.

## 2023-07-21 ENCOUNTER — Other Ambulatory Visit: Payer: No Typology Code available for payment source

## 2023-07-24 ENCOUNTER — Other Ambulatory Visit: Payer: Self-pay | Admitting: Obstetrics and Gynecology

## 2023-07-24 ENCOUNTER — Ambulatory Visit
Admission: RE | Admit: 2023-07-24 | Discharge: 2023-07-24 | Disposition: A | Discharge status: Home / Self Care | Source: Ambulatory Visit | Attending: Obstetrics and Gynecology | Admitting: Obstetrics and Gynecology

## 2023-07-24 ENCOUNTER — Other Ambulatory Visit (INDEPENDENT_AMBULATORY_CARE_PROVIDER_SITE_OTHER)

## 2023-07-24 DIAGNOSIS — I10 Essential (primary) hypertension: Secondary | ICD-10-CM | POA: Diagnosis not present

## 2023-07-24 DIAGNOSIS — N6320 Unspecified lump in the left breast, unspecified quadrant: Secondary | ICD-10-CM

## 2023-07-24 LAB — COMPREHENSIVE METABOLIC PANEL WITH GFR
ALT: 14 U/L (ref 0–35)
AST: 17 U/L (ref 0–37)
Albumin: 4.1 g/dL (ref 3.5–5.2)
Alkaline Phosphatase: 39 U/L (ref 39–117)
BUN: 14 mg/dL (ref 6–23)
CO2: 27 meq/L (ref 19–32)
Calcium: 9.5 mg/dL (ref 8.4–10.5)
Chloride: 103 meq/L (ref 96–112)
Creatinine, Ser: 0.69 mg/dL (ref 0.40–1.20)
GFR: 99.11 mL/min (ref >60.00)
Glucose, Bld: 95 mg/dL (ref 70–99)
Potassium: 3.9 meq/L (ref 3.5–5.1)
Sodium: 137 meq/L (ref 135–145)
Total Bilirubin: 0.6 mg/dL (ref 0.2–1.2)
Total Protein: 7 g/dL (ref 6.0–8.3)

## 2023-07-25 ENCOUNTER — Encounter: Payer: Self-pay | Admitting: Family

## 2023-08-11 ENCOUNTER — Other Ambulatory Visit: Payer: Self-pay | Admitting: Family

## 2023-08-11 DIAGNOSIS — F419 Anxiety disorder, unspecified: Secondary | ICD-10-CM

## 2023-08-11 NOTE — Telephone Encounter (Unsigned)
 Copied from CRM [id number]. Topic: Clinical - Prescription Issue >> Aug 11, 2023 12:42 PM Kita Perish H wrote: Reason for CRM: Patient is calling to check status of refill request for her venlafaxine  XR (EFFEXOR -XR) 75 MG 24 hr capsule, advised patient that request was received refills take 2-3 business days, patient states she is out and need refill sent in today, please reach out, thanks.  Eyana [phone]

## 2023-08-23 MED ORDER — ZOLPIDEM TARTRATE 5 MG PO TABS: 5.0000 mg | ORAL_TABLET | Freq: Every evening | ORAL | 0 refills | Status: AC | PRN

## 2023-08-23 NOTE — Addendum Note (Signed)
 Addended by: Dorrene Gaucher on: 08/23/2023 08:06 AM   Modules accepted: Orders

## 2023-09-27 ENCOUNTER — Other Ambulatory Visit: Payer: Self-pay | Admitting: Family

## 2023-09-27 DIAGNOSIS — I10 Essential (primary) hypertension: Secondary | ICD-10-CM

## 2023-09-29 ENCOUNTER — Encounter: Payer: Self-pay | Admitting: Family

## 2023-11-09 ENCOUNTER — Other Ambulatory Visit: Payer: Self-pay | Admitting: Family

## 2023-11-09 DIAGNOSIS — F32A Depression, unspecified: Secondary | ICD-10-CM

## 2023-12-12 ENCOUNTER — Encounter: Payer: Self-pay | Admitting: Family

## 2024-01-01 ENCOUNTER — Other Ambulatory Visit: Payer: Self-pay | Admitting: Family

## 2024-01-01 DIAGNOSIS — I10 Essential (primary) hypertension: Secondary | ICD-10-CM

## 2024-01-26 ENCOUNTER — Other Ambulatory Visit

## 2024-01-26 ENCOUNTER — Encounter

## 2024-02-06 ENCOUNTER — Ambulatory Visit
Admission: RE | Admit: 2024-02-06 | Discharge: 2024-02-06 | Disposition: A | Discharge status: Home / Self Care | Source: Ambulatory Visit | Attending: Obstetrics and Gynecology | Admitting: Obstetrics and Gynecology

## 2024-02-06 ENCOUNTER — Ambulatory Visit: Admitting: Family

## 2024-02-06 ENCOUNTER — Encounter: Payer: Self-pay | Admitting: Family

## 2024-02-06 ENCOUNTER — Ambulatory Visit: Payer: Self-pay | Admitting: Family

## 2024-02-06 VITALS — BP 134/80 | HR 68 | Temp 98.1°F | Resp 16 | Ht 65.5 in | Wt 144.8 lb

## 2024-02-06 DIAGNOSIS — I10 Essential (primary) hypertension: Secondary | ICD-10-CM

## 2024-02-06 DIAGNOSIS — G43809 Other migraine, not intractable, without status migrainosus: Secondary | ICD-10-CM

## 2024-02-06 DIAGNOSIS — Z23 Encounter for immunization: Secondary | ICD-10-CM

## 2024-02-06 DIAGNOSIS — F419 Anxiety disorder, unspecified: Secondary | ICD-10-CM

## 2024-02-06 DIAGNOSIS — Z8639 Personal history of other endocrine, nutritional and metabolic disease: Secondary | ICD-10-CM | POA: Diagnosis not present

## 2024-02-06 DIAGNOSIS — F32A Depression, unspecified: Secondary | ICD-10-CM

## 2024-02-06 DIAGNOSIS — N6320 Unspecified lump in the left breast, unspecified quadrant: Secondary | ICD-10-CM

## 2024-02-06 DIAGNOSIS — Z800 Family history of malignant neoplasm of digestive organs: Secondary | ICD-10-CM

## 2024-02-06 LAB — BASIC METABOLIC PANEL WITH GFR
BUN: 13 mg/dL (ref 6–23)
CO2: 28 meq/L (ref 19–32)
Calcium: 9.3 mg/dL (ref 8.4–10.5)
Chloride: 101 meq/L (ref 96–112)
Creatinine, Ser: 0.72 mg/dL (ref 0.40–1.20)
GFR: 95.12 mL/min (ref >60.00)
Glucose, Bld: 92 mg/dL (ref 70–99)
Potassium: 4.4 meq/L (ref 3.5–5.1)
Sodium: 135 meq/L (ref 135–145)

## 2024-02-06 MED ORDER — METOPROLOL SUCCINATE ER 50 MG PO TB24
50.0000 mg | ORAL_TABLET | Freq: Every day | ORAL | 1 refills | Status: AC
Start: 2024-02-06 — End: ?

## 2024-02-06 MED ORDER — HYDROCHLOROTHIAZIDE 25 MG PO TABS
25.0000 mg | ORAL_TABLET | Freq: Every day | ORAL | 1 refills | Status: AC
Start: 2024-02-06 — End: ?

## 2024-02-06 MED ORDER — VENLAFAXINE HCL ER 75 MG PO CP24
75.0000 mg | ORAL_CAPSULE | Freq: Every day | ORAL | 1 refills | Status: AC
Start: 2024-02-06 — End: ?

## 2024-02-06 MED ORDER — RIZATRIPTAN BENZOATE 10 MG PO TABS: 10.0000 mg | ORAL_TABLET | ORAL | 5 refills | Status: AC | PRN

## 2024-02-06 NOTE — Assessment & Plan Note (Signed)
 She is being monitored every 5 years with colonoscopy.

## 2024-02-06 NOTE — Progress Notes (Signed)
 Subjective:     Patient ID: Whitney King, female    DOB: 02/15/1971, 53 y.o.   MRN: [id number]  Chief Complaint  Patient presents with   Hypertension    Here for follow up   Migraine    Here for follow up    Hypertension  Migraine  Her past medical history is significant for hypertension.    Discussed the use of AI scribe software for clinical note transcription with the patient, who gave verbal consent to proceed.  History of Present Illness  Whitney King is a 53 year old female who presents for a follow-up on her medications.  She manages hypertension with Toprol  XL 50 mg daily and has been out of hydrochlorothiazide  for one day. She prefers to receive 90 tablets at a time from [facility] on [address].  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  Her mood is stable on Effexor .  She is due for a mammogram and ultrasound every six months and a Pap smear every six months due to previous findings. She follows a five-year schedule for colonoscopy. She has fibrocystic breasts with a painful cyst on one side, which is being monitored.[facility].   She has completed the hepatitis B series and plans to receive her flu shot at work.   Health Maintenance Due  Topic Date Due   Pneumococcal Vaccine: 50+ Years (1 of 1 - PCV) Never done   Mammogram  11/28/2023    Past Medical History:  Diagnosis Date   ABNORMAL FINDINGS, ELEVATED BP W/O HTN 09/13/2006   Qualifier: Diagnosis of  By: Amon MD, Jose E.    ACNE NEC 09/08/2006   Qualifier: Diagnosis of  By: Amon MD, Aloysius BRAVO.    Allergy    Anxiety    Anxiety and depression 09/08/2006   Qualifier: Diagnosis of  By: Amon MD, Aloysius BRAVO.    Arthritis    mild    Asthma    exercise induced, allergies, no problems since allergy shots   Bipolar disorder (HCC)    pt denies    GERD (gastroesophageal reflux  disease)    diet controlled only   Headache(784.0)    otc meds prn   History of hypothyroidism 05/18/2016   HTN (hypertension) 03/16/2015   Hypertension    no medications at this time- post pregnancy   Hypothyroidism 05/18/2016   IBS (irritable bowel syndrome)    Mental disorder    Migraines    OTHER&UNSPECIFIED DISEASES THE ORAL SOFT TISSUES 03/24/2009   Qualifier: Diagnosis of  By: Tish MD, William     PONV (postoperative nausea and vomiting)    Positive ANA (antinuclear antibody) 07/26/2017   Seasonal allergies    SVD (spontaneous vaginal delivery)    x 1    Past Surgical History:  Procedure Laterality Date   COLONOSCOPY     COLONOSCOPY W/ POLYPECTOMY     DILITATION & CURRETTAGE/HYSTROSCOPY WITH NOVASURE ABLATION N/A 10/17/2012   Procedure: DILATATION & CURETTAGE/HYSTEROSCOPY WITH NOVASURE ABLATION;  Surgeon: Rosaline DELENA Luna, MD;  Location: [facility];  Service: Gynecology;  Laterality: N/A;   EYE SURGERY     PRK - bilateral eyes   LAPAROSCOPIC TUBAL LIGATION Bilateral 07/15/2015   Procedure: LAPAROSCOPIC TUBAL LIGATION;  Surgeon: Rosaline Luna, MD;  Location: [facility];  Service: Gynecology;  Laterality: Bilateral;  FILSHIE CLIPS   left leg vein surgery Bilateral  Laser   left wrist surgery  2012   fracture repair   LUMBAR LAMINECTOMY/DECOMPRESSION MICRODISCECTOMY N/A 10/28/2015   Procedure: LUMBAR 4-5 DECOMPRESSION ;  Surgeon: Oneil Priestly, MD;  Location: [facility];  Service: Orthopedics;  Laterality: N/A;  LUMBAR 4-5 DECOMPRESSION - cyst removed that was pressing on nerve per pt    POLYPECTOMY     RECTOCELE REPAIR N/A 10/17/2012   Procedure: POSTERIOR REPAIR (RECTOCELE);  Surgeon: Rosaline DELENA Luna, MD;  Location: [facility];  Service: Gynecology;  Laterality: N/A;   RIGHT ANKLE SURGERY  1996   RECONSTRUCTION   WISDOM TOOTH EXTRACTION      Family History  Problem Relation Age of Onset   Colon cancer Mother        in her 34's    Hyperlipidemia Mother    Hypertension  Mother    Colon polyps Mother    Atrial fibrillation Mother        ablation   Colon cancer Father 58       49-80 or so    Heart disease Father    Hypertension Father    Hypertension Maternal Grandmother    Stroke Paternal Grandfather    Esophageal cancer Neg Hx    Rectal cancer Neg Hx    Stomach cancer Neg Hx     Social History   Socioeconomic History   Marital status: Married    Spouse name: Not on file   Number of children: 2   Years of education: Not on file   Highest education level: Master's degree (e.g., MA, MS, MEng, MEd, MSW, MBA)  Occupational History   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    Comment: [employer]  Tobacco Use   Smoking status: Former    Current packs/day: 0.00    Average packs/day: 0.1 packs/day for 20.0 years (1.0 ttl pk-yrs)    Types: Cigarettes    Start date: 02/10/1995    Quit date: 02/10/2015    Years since quitting: 8.9   Smokeless tobacco: Never  Substance and Sexual Activity   Alcohol use: Yes    Alcohol/week: 2.0 standard drinks of alcohol    Types: 2 Glasses of wine per week    Comment: socially   Drug use: No   Sexual activity: Yes    Birth control/protection: None  Other Topics Concern   Not on file  Social History Narrative   Second Marriage   2 children   Works as an Mudlogger PA   Enjoys gym, kayak, paddle, boarding, outside activities   Social Drivers of Health   Financial Resource Strain: Patient Declined (09/20/2022)   Overall Financial Resource Strain (CARDIA)    Difficulty of Paying Living Expenses: Patient declined  Food Insecurity: No Food Insecurity (09/20/2022)   Hunger Vital Sign    Worried About Running Out of Food in the Last Year: Never true    Ran Out of Food in the Last Year: Never true  Transportation Needs: No Transportation Needs (09/20/2022)   PRAPARE - Administrator, Civil Service (Medical): No    Lack of Transportation (Non-Medical): No  Physical Activity: Insufficiently Active  (09/20/2022)   Exercise Vital Sign    Days of Exercise per Week: 3 days    Minutes of Exercise per Session: 30 min  Stress: No Stress Concern Present (09/20/2022)   [employer] of Occupational Health - Occupational Stress Questionnaire    Feeling of Stress : Only a little  Social Connections: Unknown (09/20/2022)   Social Connection and Isolation  Panel    Frequency of Communication with Friends and Family: More than three times a week    Frequency of Social Gatherings with Friends and Family: More than three times a week    Attends Religious Services: Patient declined    Database administrator or Organizations: Yes    Attends Banker Meetings: Patient declined    Marital Status: Divorced  Catering manager Violence: Not on file    Outpatient Medications Prior to Visit  Medication Sig Dispense Refill   estradiol  (ESTRACE ) 0.5 MG tablet TK 1 T PO QD 30 tablet 0   metroNIDAZOLE (METROGEL) 1 % gel metronidazole 1 % topical gel  APP EXT AA QD     progesterone (PROMETRIUM) 100 MG capsule Take 100 mg by mouth daily.     valACYclovir  (VALTREX ) 1000 MG tablet 2 tabs by mouth at start of cold sore then 2 tabs 12 hours later 20 tablet 1   hydrochlorothiazide  (HYDRODIURIL ) 25 MG tablet Take 1 tablet (25 mg total) by mouth daily. Needs appt 30 tablet 0   metoprolol  succinate (TOPROL -XL) 50 MG 24 hr tablet Take 1 tablet (50 mg total) by mouth daily. Take with or immediately following a meal. Needs appt 30 tablet 0   rizatriptan  (MAXALT ) 10 MG tablet Take 1 tablet (10 mg total) by mouth as needed for migraine. May repeat in 2 hours if needed 10 tablet 5   venlafaxine  XR (EFFEXOR -XR) 75 MG 24 hr capsule TAKE 1 CAPSULE(75 MG) BY MOUTH DAILY WITH BREAKFAST 90 capsule 0   hydrOXYzine  (VISTARIL ) 25 MG capsule Take 1 capsule (25 mg total) by mouth every 8 (eight) hours as needed. (Patient not taking: Reported on 02/06/2024) 30 capsule 0   zolpidem  (AMBIEN ) 5 MG tablet Take 1 tablet (5 mg  total) by mouth at bedtime as needed for sleep. 3 tablet 0   No facility-administered medications prior to visit.    Allergies  Allergen Reactions   Lisinopril      Lip swelling   Erythromycin Diarrhea    ROS See HPI    Objective:    Physical Exam Constitutional:      General: She is not in acute distress.    Appearance: Normal appearance. She is well-developed.  HENT:     Head: Normocephalic and atraumatic.     Right Ear: External ear normal.     Left Ear: External ear normal.  Eyes:     General: No scleral icterus. Neck:     Thyroid : No thyromegaly.  Cardiovascular:     Rate and Rhythm: Normal rate and regular rhythm.     Heart sounds: Normal heart sounds. No murmur heard. Pulmonary:     Effort: Pulmonary effort is normal. No respiratory distress.     Breath sounds: Normal breath sounds. No wheezing.  Musculoskeletal:     Cervical back: Neck supple.  Skin:    General: Skin is warm and dry.  Neurological:     Mental Status: She is alert and oriented to person, place, and time.  Psychiatric:        Mood and Affect: Mood normal.        Behavior: Behavior normal.        Thought Content: Thought content normal.        Judgment: Judgment normal.      BP 134/80 (BP Location: Right Arm, Patient Position: Sitting, Cuff Size: Normal)   Pulse 68   Temp 98.1 F (36.7 C) (Oral)   Resp 16  Ht 5' 5.5 (1.664 m)   Wt 144 lb 12.8 oz (65.7 kg)   SpO2 100%   BMI 23.73 kg/m  Wt Readings from Last 3 Encounters:  02/06/24 144 lb 12.8 oz (65.7 kg)  07/19/23 151 lb (68.5 kg)  09/26/22 158 lb (71.7 kg)       Assessment & Plan:   Problem List Items Addressed This Visit       Unprioritized   Migraines   Has had some recent migraines which she attributes to stress. Requesting refill on maxalt .       Relevant Medications   hydrochlorothiazide  (HYDRODIURIL ) 25 MG tablet   metoprolol  succinate (TOPROL -XL) 50 MG 24 hr tablet   rizatriptan  (MAXALT ) 10 MG tablet    venlafaxine  XR (EFFEXOR -XR) 75 MG 24 hr capsule   HTN (hypertension)   BP stable, continue hydrochlorothiazide  and toprol  xl.       Relevant Medications   hydrochlorothiazide  (HYDRODIURIL ) 25 MG tablet   metoprolol  succinate (TOPROL -XL) 50 MG 24 hr tablet   Other Relevant Orders   Basic Metabolic Panel (BMET)   RESOLVED: History of hypothyroidism - Primary   TSH has been stable without synthroid  for many years.        Family history of colon cancer   She is being monitored every 5 years with colonoscopy.       Anxiety and depression   Stable on effexor , continue same.       Relevant Medications   venlafaxine  XR (EFFEXOR -XR) 75 MG 24 hr capsule   Prevnar 20 today. She will get flu shot at work.  I have changed Whitney Levels venlafaxine  XR. I am also having her maintain her progesterone, metroNIDAZOLE, hydrOXYzine , valACYclovir , estradiol , zolpidem , hydrochlorothiazide , metoprolol  succinate, and rizatriptan .  Meds ordered this encounter  Medications   hydrochlorothiazide  (HYDRODIURIL ) 25 MG tablet    Sig: Take 1 tablet (25 mg total) by mouth daily. Needs appt    Dispense:  90 tablet    Refill:  1    Supervising Provider:   DOMENICA BLACKBIRD A [[id number]]   metoprolol  succinate (TOPROL -XL) 50 MG 24 hr tablet    Sig: Take 1 tablet (50 mg total) by mouth daily. Take with or immediately following a meal. Needs appt    Dispense:  90 tablet    Refill:  1    Supervising Provider:   DOMENICA BLACKBIRD A [[id number]]   rizatriptan  (MAXALT ) 10 MG tablet    Sig: Take 1 tablet (10 mg total) by mouth as needed for migraine. May repeat in 2 hours if needed    Dispense:  10 tablet    Refill:  5    Supervising Provider:   DOMENICA BLACKBIRD A [[id number]]   venlafaxine  XR (EFFEXOR -XR) 75 MG 24 hr capsule    Sig: Take 1 capsule (75 mg total) by mouth daily with breakfast.    Dispense:  90 capsule    Refill:  1    Supervising Provider:   DOMENICA BLACKBIRD A [[id number]]

## 2024-02-06 NOTE — Assessment & Plan Note (Signed)
 TSH has been stable without synthroid  for many years.

## 2024-02-06 NOTE — Assessment & Plan Note (Signed)
 BP stable, continue hydrochlorothiazide  and toprol  xl.

## 2024-02-06 NOTE — Assessment & Plan Note (Signed)
 Has had some recent migraines which she attributes to stress. Requesting refill on maxalt .

## 2024-02-06 NOTE — Patient Instructions (Addendum)
 VISIT SUMMARY:  You came in for a follow-up on your medications. We discussed your hypertension, recent migraines, and fibrocystic breast changes. We also reviewed your upcoming health screenings.  YOUR PLAN:  MIGRAINE: You have had recent migraines likely due to work-related stress. -I have sent a refill for your Maxalt  prescription.  ESSENTIAL HYPERTENSION: Your blood pressure is well-controlled at 134/80 mmHg. You have been out of hydrochlorothiazide  for one day. -I have sent a refill for your hydrochlorothiazide . -I have sent a refill for your Toprol  XL 50 mg, 90 tablets to [facility] on [address].  ANXIETY/DEPRESSION- Your mood remains stable on effexor .   FIBROCYSTIC BREAST CHANGES: You have a painful cyst on your left breast.  -Continue with your mammogram and ultrasound every six months.

## 2024-02-06 NOTE — Assessment & Plan Note (Signed)
Stable on effexor, continue same. 

## 2024-04-02 NOTE — Telephone Encounter (Signed)
 Can you please contact pt and ask her if she is taking prometrium as well as the estrogen?  Is she still following with GYN, if so, I would like her to continue refills through her GYN.

## 2024-04-04 NOTE — Telephone Encounter (Signed)
 Per patient she is not taking prometrium and she is getting her medication from GYN
